# Patient Record
Sex: Male | Born: 1940 | Race: White | Hispanic: No | Marital: Married | Smoking: Former smoker
Health system: Southern US, Community
[De-identification: ages and names within clinical notes are randomized; demographics above are authoritative.]

## PROBLEM LIST (undated history)

## (undated) DIAGNOSIS — R609 Edema, unspecified: Secondary | ICD-10-CM

## (undated) DIAGNOSIS — F4310 Post-traumatic stress disorder, unspecified: Secondary | ICD-10-CM

## (undated) DIAGNOSIS — N200 Calculus of kidney: Secondary | ICD-10-CM

## (undated) DIAGNOSIS — L570 Actinic keratosis: Secondary | ICD-10-CM

## (undated) DIAGNOSIS — F32A Depression, unspecified: Secondary | ICD-10-CM

## (undated) DIAGNOSIS — R0600 Dyspnea, unspecified: Secondary | ICD-10-CM

## (undated) DIAGNOSIS — Z8719 Personal history of other diseases of the digestive system: Secondary | ICD-10-CM

## (undated) DIAGNOSIS — F329 Major depressive disorder, single episode, unspecified: Secondary | ICD-10-CM

## (undated) DIAGNOSIS — K219 Gastro-esophageal reflux disease without esophagitis: Secondary | ICD-10-CM

## (undated) DIAGNOSIS — E785 Hyperlipidemia, unspecified: Secondary | ICD-10-CM

## (undated) DIAGNOSIS — E119 Type 2 diabetes mellitus without complications: Secondary | ICD-10-CM

## (undated) DIAGNOSIS — R12 Heartburn: Secondary | ICD-10-CM

## (undated) DIAGNOSIS — F419 Anxiety disorder, unspecified: Secondary | ICD-10-CM

## (undated) DIAGNOSIS — C439 Malignant melanoma of skin, unspecified: Secondary | ICD-10-CM

## (undated) DIAGNOSIS — E039 Hypothyroidism, unspecified: Secondary | ICD-10-CM

## (undated) DIAGNOSIS — M1990 Unspecified osteoarthritis, unspecified site: Secondary | ICD-10-CM

## (undated) DIAGNOSIS — N400 Enlarged prostate without lower urinary tract symptoms: Secondary | ICD-10-CM

## (undated) HISTORY — DX: Depression, unspecified: F32.A

## (undated) HISTORY — DX: Actinic keratosis: L57.0

## (undated) HISTORY — PX: OTHER SURGICAL HISTORY: SHX169

## (undated) HISTORY — DX: Anxiety disorder, unspecified: F41.9

## (undated) HISTORY — DX: Heartburn: R12

## (undated) HISTORY — DX: Hyperlipidemia, unspecified: E78.5

## (undated) HISTORY — PX: HERNIA REPAIR: SHX51

## (undated) HISTORY — PX: TONSILLECTOMY: SUR1361

## (undated) HISTORY — PX: PROSTATE SURGERY: SHX751

## (undated) HISTORY — DX: Benign prostatic hyperplasia without lower urinary tract symptoms: N40.0

## (undated) HISTORY — DX: Post-traumatic stress disorder, unspecified: F43.10

## (undated) HISTORY — DX: Type 2 diabetes mellitus without complications: E11.9

## (undated) HISTORY — DX: Calculus of kidney: N20.0

## (undated) HISTORY — DX: Malignant melanoma of skin, unspecified: C43.9

## (undated) HISTORY — DX: Major depressive disorder, single episode, unspecified: F32.9

## (undated) HISTORY — DX: Unspecified osteoarthritis, unspecified site: M19.90

---
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
## 2006-03-09 ENCOUNTER — Ambulatory Visit: Payer: Self-pay | Admitting: Gastroenterology

## 2008-01-24 DIAGNOSIS — D239 Other benign neoplasm of skin, unspecified: Secondary | ICD-10-CM

## 2008-01-24 HISTORY — DX: Other benign neoplasm of skin, unspecified: D23.9

## 2008-05-11 ENCOUNTER — Ambulatory Visit: Payer: Self-pay | Admitting: Otolaryngology

## 2008-06-05 ENCOUNTER — Other Ambulatory Visit: Payer: Self-pay

## 2008-06-06 ENCOUNTER — Observation Stay: Payer: Self-pay | Admitting: Internal Medicine

## 2008-11-30 ENCOUNTER — Ambulatory Visit: Payer: Self-pay | Admitting: Family Medicine

## 2011-09-25 ENCOUNTER — Ambulatory Visit: Payer: Self-pay | Admitting: Family Medicine

## 2011-09-29 ENCOUNTER — Ambulatory Visit: Payer: Self-pay | Admitting: Family Medicine

## 2011-11-09 ENCOUNTER — Ambulatory Visit: Payer: Self-pay | Admitting: Family Medicine

## 2011-11-27 ENCOUNTER — Ambulatory Visit: Payer: Self-pay | Admitting: Family Medicine

## 2011-12-27 ENCOUNTER — Ambulatory Visit: Payer: Self-pay | Admitting: Family Medicine

## 2012-01-09 ENCOUNTER — Ambulatory Visit: Payer: Self-pay | Admitting: Internal Medicine

## 2012-03-28 ENCOUNTER — Ambulatory Visit: Payer: Self-pay | Admitting: Gastroenterology

## 2012-07-15 ENCOUNTER — Ambulatory Visit: Payer: Self-pay | Admitting: Urology

## 2012-07-15 LAB — CBC WITH DIFFERENTIAL/PLATELET
Basophil #: 0 10*3/uL (ref 0.0–0.1)
Eosinophil %: 8.1 %
HCT: 43.3 % (ref 40.0–52.0)
HGB: 14.1 g/dL (ref 13.0–18.0)
Lymphocyte #: 1.9 10*3/uL (ref 1.0–3.6)
MCHC: 32.6 g/dL (ref 32.0–36.0)
MCV: 89 fL (ref 80–100)
Monocyte %: 6.9 %
Neutrophil #: 3.4 10*3/uL (ref 1.4–6.5)
Platelet: 292 10*3/uL (ref 150–440)
RBC: 4.85 10*6/uL (ref 4.40–5.90)
RDW: 13.3 % (ref 11.5–14.5)
WBC: 6.3 10*3/uL (ref 3.8–10.6)

## 2012-07-17 ENCOUNTER — Ambulatory Visit: Payer: Self-pay | Admitting: Urology

## 2014-12-15 NOTE — Op Note (Signed)
PATIENT NAME:  Joseph Clements, Joseph Clements MR#:  [id number] DATE OF BIRTH:  07/28/1941  DATE OF PROCEDURE:  07/17/2012  PREOPERATIVE DIAGNOSES:  1. Benign prostatic hypertrophy with obstruction.  2. Urinary retention.   POSTOPERATIVE DIAGNOSES:  1. Benign prostatic hypertrophy with obstruction.  2. Urinary retention.   PROCEDURE: Photoselective vaporization of the prostate.   SURGEON: John Giovanni, MD   ASSISTANT: None.   ANESTHESIA: General.   INDICATIONS: A 74 year old male with a long history of benign prostatic hypertrophy who has been on alpha blocker for several years with progressively worsening lower urinary tract symptoms. He recently developed urinary retention. He has failed voiding trials x2. Cystoscopy was remarkable for trilobar enlargement. After discussion of treatment options, he has elected photoselective vaporization of the prostate.   DESCRIPTION OF PROCEDURE: He was taken to the Operating Room where general anesthetic was administered. He was placed in the low lithotomy position, and his Foley catheter was removed, and he was prepped and draped in the usual fashion.  A timeout was performed. A 22-French continuous-flow laser cystoscope was lubricated and passed under direct vision. The urethra was normal in caliber without stricture. There was lateral lobe enlargement and a large median lobe present. There was marked bladder neck elevation present. Prostatic urethral length was approximately 3 cm. No bladder mucosal lesions were identified. A small jack stone was present in the bladder. A GreenLight laser fiber was placed through the cystoscope. The GreenLight XPS generator was used. The median lobe bladder neck was then vaporized starting at a power of 80 watts. Once additional room was obtained, the power was subsequently increased to 150 watts. The median lobe was vaporized. The lateral lobes were then sequentially vaporized from the bladder neck to the verumontanum. Hemostasis was  obtained with laser coagulation. At the completion of the procedure, hemostasis was adequate. With the scope positioned at the verumontanum, the channel was open. Trigone was inspected and was normal in appearance. Ureteral orifices were remarkable for clear efflux. The jack stone was ensnared with the basket and removed without difficulty. The cystoscope was removed. A 20-French Foley catheter was placed with return of light rosae effluent upon irrigation. A B and O suppository was placed per rectum. He was taken to the PAC-U in stable condition. There were no complications. EBL was minimal.    ____________________________ Ronda Fairly. Bernardo Heater, MD scs:cbb D: 07/18/2012 07:08:25 ET T: 07/18/2012 10:08:49 ET JOB#: [id number]  cc: Nicki Reaper C. Bernardo Heater, MD, <Dictator> Abbie Sons MD ELECTRONICALLY SIGNED 08/14/2012 9:49

## 2015-10-14 ENCOUNTER — Encounter: Payer: Self-pay | Admitting: Adult Health

## 2015-10-15 ENCOUNTER — Encounter: Payer: Self-pay | Admitting: Urology

## 2015-10-15 ENCOUNTER — Ambulatory Visit (INDEPENDENT_AMBULATORY_CARE_PROVIDER_SITE_OTHER): Payer: Medicare Other | Admitting: Urology

## 2015-10-15 VITALS — BP 127/85 | HR 93 | Ht 66.5 in | Wt 164.7 lb

## 2015-10-15 DIAGNOSIS — N138 Other obstructive and reflux uropathy: Secondary | ICD-10-CM

## 2015-10-15 DIAGNOSIS — R310 Gross hematuria: Secondary | ICD-10-CM

## 2015-10-15 DIAGNOSIS — N200 Calculus of kidney: Secondary | ICD-10-CM | POA: Diagnosis not present

## 2015-10-15 DIAGNOSIS — N500 Atrophy of testis: Secondary | ICD-10-CM | POA: Insufficient documentation

## 2015-10-15 DIAGNOSIS — N401 Enlarged prostate with lower urinary tract symptoms: Secondary | ICD-10-CM

## 2015-10-15 LAB — URINALYSIS, COMPLETE
BILIRUBIN UA: NEGATIVE
GLUCOSE, UA: NEGATIVE
Leukocytes, UA: NEGATIVE
NITRITE UA: NEGATIVE
SPEC GRAV UA: 1.025 (ref 1.005–1.030)
Urobilinogen, Ur: 0.2 mg/dL (ref 0.2–1.0)
pH, UA: 5 (ref 5.0–7.5)

## 2015-10-15 LAB — MICROSCOPIC EXAMINATION

## 2015-10-15 MED ORDER — DIPHENHYDRAMINE HCL 50 MG PO TABS: ORAL_TABLET | ORAL | Status: DC

## 2015-10-15 MED ORDER — RANITIDINE HCL 150 MG PO TABS: ORAL_TABLET | ORAL | Status: DC

## 2015-10-15 MED ORDER — PREDNISONE 50 MG PO TABS: ORAL_TABLET | ORAL | Status: DC
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
## 2015-10-16 LAB — BUN+CREAT
BUN / CREAT RATIO: 8 — AB (ref 10–22)
BUN: 15 mg/dL (ref 8–27)
Creatinine, Ser: 1.94 mg/dL — ABNORMAL HIGH (ref 0.76–1.27)
GFR calc Af Amer: 38 mL/min/{1.73_m2} — ABNORMAL LOW (ref >59)
GFR calc non Af Amer: 33 mL/min/{1.73_m2} — ABNORMAL LOW (ref >59)

## 2015-10-17 LAB — CULTURE, URINE COMPREHENSIVE

## 2015-10-18 ENCOUNTER — Telehealth: Payer: Self-pay

## 2015-10-18 ENCOUNTER — Other Ambulatory Visit: Payer: Self-pay | Admitting: Urology

## 2015-10-18 DIAGNOSIS — R3129 Other microscopic hematuria: Secondary | ICD-10-CM

## 2015-10-18 NOTE — Telephone Encounter (Signed)
Spoke with pt in reference to diminished renal function. Pt stated that he has picked up the benadryl that was called in. Pt asked does he still need to take that? Please advise.

## 2015-10-18 NOTE — Telephone Encounter (Signed)
Per [name] pt will have a CT of abd/pelivs without contrast due to kidney function. Spoke with pt in reference to CT and benadryl prior. Made aware no need to take benadryl due to no contrast being used. Pt voiced understanding.

## 2015-10-18 NOTE — Telephone Encounter (Signed)
-----   Message from Nori Riis, PA-C sent at 10/17/2015  6:34 PM EST ----- Patient's renal function is diminished.  He cannot have the contrast.  He does not need to take the prep.

## 2015-10-21 ENCOUNTER — Ambulatory Visit
Admission: RE | Admit: 2015-10-21 | Discharge: 2015-10-21 | Disposition: A | Discharge status: Home / Self Care | Payer: Medicare Other | Source: Ambulatory Visit | Attending: Urology | Admitting: Urology

## 2015-10-21 DIAGNOSIS — R3129 Other microscopic hematuria: Secondary | ICD-10-CM | POA: Diagnosis present

## 2015-10-21 DIAGNOSIS — N132 Hydronephrosis with renal and ureteral calculous obstruction: Secondary | ICD-10-CM | POA: Diagnosis not present

## 2015-10-21 DIAGNOSIS — N201 Calculus of ureter: Secondary | ICD-10-CM | POA: Insufficient documentation

## 2015-10-21 DIAGNOSIS — K5730 Diverticulosis of large intestine without perforation or abscess without bleeding: Secondary | ICD-10-CM | POA: Diagnosis not present

## 2015-10-21 DIAGNOSIS — N400 Enlarged prostate without lower urinary tract symptoms: Secondary | ICD-10-CM | POA: Insufficient documentation

## 2015-10-22 ENCOUNTER — Ambulatory Visit (INDEPENDENT_AMBULATORY_CARE_PROVIDER_SITE_OTHER): Payer: Medicare Other | Admitting: Urology

## 2015-10-22 ENCOUNTER — Ambulatory Visit
Admission: RE | Admit: 2015-10-22 | Discharge: 2015-10-22 | Disposition: A | Discharge status: Home / Self Care | Payer: Medicare Other | Source: Ambulatory Visit | Attending: Urology | Admitting: Urology

## 2015-10-22 ENCOUNTER — Encounter: Payer: Self-pay | Admitting: Urology

## 2015-10-22 ENCOUNTER — Encounter
Admission: RE | Admit: 2015-10-22 | Discharge: 2015-10-22 | Disposition: A | Discharge status: Home / Self Care | Payer: Medicare Other | Source: Ambulatory Visit

## 2015-10-22 VITALS — BP 120/77 | HR 91 | Ht 66.0 in | Wt 162.6 lb

## 2015-10-22 DIAGNOSIS — N401 Enlarged prostate with lower urinary tract symptoms: Secondary | ICD-10-CM

## 2015-10-22 DIAGNOSIS — R310 Gross hematuria: Secondary | ICD-10-CM

## 2015-10-22 DIAGNOSIS — N500 Atrophy of testis: Secondary | ICD-10-CM | POA: Diagnosis not present

## 2015-10-22 DIAGNOSIS — N201 Calculus of ureter: Secondary | ICD-10-CM | POA: Insufficient documentation

## 2015-10-22 DIAGNOSIS — N138 Other obstructive and reflux uropathy: Secondary | ICD-10-CM

## 2015-10-22 DIAGNOSIS — N200 Calculus of kidney: Secondary | ICD-10-CM | POA: Insufficient documentation

## 2015-10-22 DIAGNOSIS — Z01810 Encounter for preprocedural cardiovascular examination: Secondary | ICD-10-CM | POA: Insufficient documentation

## 2015-10-22 LAB — URINALYSIS, COMPLETE
Bilirubin, UA: NEGATIVE
Glucose, UA: NEGATIVE
KETONES UA: NEGATIVE
NITRITE UA: POSITIVE — AB
SPEC GRAV UA: 1.025 (ref 1.005–1.030)
Urobilinogen, Ur: 0.2 mg/dL (ref 0.2–1.0)
pH, UA: 5 (ref 5.0–7.5)

## 2015-10-22 LAB — MICROSCOPIC EXAMINATION

## 2015-10-22 MED ORDER — ONDANSETRON 4 MG PO TBDP: 4.0000 mg | ORAL_TABLET | Freq: Three times a day (TID) | ORAL | Status: DC | PRN

## 2015-10-22 MED ORDER — SULFAMETHOXAZOLE-TRIMETHOPRIM 800-160 MG PO TABS: 1.0000 | ORAL_TABLET | Freq: Two times a day (BID) | ORAL | Status: DC

## 2015-10-22 MED ORDER — TAMSULOSIN HCL 0.4 MG PO CAPS: 0.4000 mg | ORAL_CAPSULE | Freq: Every day | ORAL | Status: DC

## 2015-10-22 NOTE — Patient Instructions (Signed)
I have sent the medication tamsulosin 0.4 mg to your pharmacy. This medication is to facilitate the passage of the right ureteral stone.  I have also sent the antibiotic Bactrim DS one tablet twice daily to your pharmacy. This medication is for infection. We will be sending her urine for culture and those results will be available Monday.  You have been given a strainer and please strain all urine. If you should pass the stone, please bring the stone to our office so we can send it for analysis.  If over the weekend, you should develop fevers or chills. You should seek treatment in the emergency room.
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
## 2015-10-24 LAB — CULTURE, URINE COMPREHENSIVE

## 2015-10-25 DIAGNOSIS — N201 Calculus of ureter: Secondary | ICD-10-CM | POA: Insufficient documentation

## 2015-10-26 ENCOUNTER — Telehealth: Payer: Self-pay

## 2015-10-26 NOTE — Telephone Encounter (Signed)
-----   Message from Nori Riis, PA-C sent at 10/22/2015  3:04 PM EST ----- Patient's stones are visible on the KUB.  They have not changed position.  Proceed with ESWL on Thursday.

## 2015-10-28 ENCOUNTER — Ambulatory Visit: Payer: Medicare Other

## 2015-10-28 ENCOUNTER — Encounter
Admission: RE | Disposition: A | Discharge status: Home / Self Care | Payer: Self-pay | Source: Ambulatory Visit | Attending: Urology

## 2015-10-28 ENCOUNTER — Encounter: Payer: Self-pay | Admitting: *Deleted

## 2015-10-28 ENCOUNTER — Ambulatory Visit
Admission: RE | Admit: 2015-10-28 | Discharge: 2015-10-28 | Disposition: A | Discharge status: Home / Self Care | Payer: Medicare Other | Source: Ambulatory Visit | Attending: Urology | Admitting: Urology

## 2015-10-28 DIAGNOSIS — F329 Major depressive disorder, single episode, unspecified: Secondary | ICD-10-CM | POA: Insufficient documentation

## 2015-10-28 DIAGNOSIS — N400 Enlarged prostate without lower urinary tract symptoms: Secondary | ICD-10-CM | POA: Diagnosis not present

## 2015-10-28 DIAGNOSIS — G4730 Sleep apnea, unspecified: Secondary | ICD-10-CM | POA: Diagnosis not present

## 2015-10-28 DIAGNOSIS — N200 Calculus of kidney: Secondary | ICD-10-CM

## 2015-10-28 DIAGNOSIS — M1990 Unspecified osteoarthritis, unspecified site: Secondary | ICD-10-CM | POA: Diagnosis not present

## 2015-10-28 DIAGNOSIS — N202 Calculus of kidney with calculus of ureter: Secondary | ICD-10-CM | POA: Diagnosis present

## 2015-10-28 DIAGNOSIS — Z79899 Other long term (current) drug therapy: Secondary | ICD-10-CM | POA: Diagnosis not present

## 2015-10-28 DIAGNOSIS — K449 Diaphragmatic hernia without obstruction or gangrene: Secondary | ICD-10-CM | POA: Diagnosis not present

## 2015-10-28 DIAGNOSIS — Z91013 Allergy to seafood: Secondary | ICD-10-CM | POA: Insufficient documentation

## 2015-10-28 DIAGNOSIS — Z79891 Long term (current) use of opiate analgesic: Secondary | ICD-10-CM | POA: Diagnosis not present

## 2015-10-28 DIAGNOSIS — E119 Type 2 diabetes mellitus without complications: Secondary | ICD-10-CM | POA: Insufficient documentation

## 2015-10-28 DIAGNOSIS — F419 Anxiety disorder, unspecified: Secondary | ICD-10-CM | POA: Diagnosis not present

## 2015-10-28 DIAGNOSIS — E785 Hyperlipidemia, unspecified: Secondary | ICD-10-CM | POA: Diagnosis not present

## 2015-10-28 DIAGNOSIS — Z85820 Personal history of malignant melanoma of skin: Secondary | ICD-10-CM | POA: Diagnosis not present

## 2015-10-28 DIAGNOSIS — F4310 Post-traumatic stress disorder, unspecified: Secondary | ICD-10-CM | POA: Insufficient documentation

## 2015-10-28 HISTORY — PX: EXTRACORPOREAL SHOCK WAVE LITHOTRIPSY: SHX1557

## 2015-10-28 LAB — GLUCOSE, CAPILLARY: GLUCOSE-CAPILLARY: 125 mg/dL — AB (ref 65–99)

## 2015-10-28 SURGERY — LITHOTRIPSY, ESWL
Anesthesia: Moderate Sedation | Laterality: Right

## 2015-10-28 MED ORDER — DIPHENHYDRAMINE HCL 50 MG/ML IJ SOLN
12.5000 mg | Freq: Once | INTRAMUSCULAR | Status: AC
  Administered 2015-10-28: 12.5 mg via INTRAVENOUS

## 2015-10-28 MED ORDER — SODIUM CHLORIDE 0.9 % IV SOLN
INTRAVENOUS | Status: DC
  Administered 2015-10-28: 500 mL via INTRAVENOUS

## 2015-10-28 MED ORDER — DIAZEPAM 5 MG PO TABS
ORAL_TABLET | ORAL | Status: AC
  Filled 2015-10-28: qty 2

## 2015-10-28 MED ORDER — ONDANSETRON 4 MG PO TBDP: 4.0000 mg | ORAL_TABLET | Freq: Three times a day (TID) | ORAL | Status: DC | PRN

## 2015-10-28 MED ORDER — HYDROCODONE-ACETAMINOPHEN 5-325 MG PO TABS: 1.0000 | ORAL_TABLET | Freq: Four times a day (QID) | ORAL | Status: DC | PRN

## 2015-10-28 MED ORDER — DIAZEPAM 5 MG PO TABS: 10.0000 mg | ORAL_TABLET | ORAL | Status: DC

## 2015-10-28 MED ORDER — DIPHENHYDRAMINE HCL 25 MG PO CAPS: 25.0000 mg | ORAL_CAPSULE | ORAL | Status: DC

## 2015-10-28 MED ORDER — CIPROFLOXACIN HCL 500 MG PO TABS
ORAL_TABLET | ORAL | Status: AC
  Filled 2015-10-28: qty 1

## 2015-10-28 MED ORDER — CIPROFLOXACIN HCL 500 MG PO TABS: 500.0000 mg | ORAL_TABLET | ORAL | Status: DC

## 2015-10-28 MED ORDER — DIPHENHYDRAMINE HCL 50 MG/ML IJ SOLN
INTRAMUSCULAR | Status: AC
  Administered 2015-10-28: 12.5 mg via INTRAVENOUS
  Filled 2015-10-28: qty 1

## 2015-10-28 MED ORDER — DIPHENHYDRAMINE HCL 25 MG PO CAPS
ORAL_CAPSULE | ORAL | Status: AC
  Filled 2015-10-28: qty 1

## 2015-10-28 MED ORDER — CIPROFLOXACIN IN D5W 400 MG/200ML IV SOLN
400.0000 mg | Freq: Once | INTRAVENOUS | Status: AC
  Administered 2015-10-28: 400 mg via INTRAVENOUS

## 2015-10-28 MED ORDER — ONDANSETRON HCL 4 MG/2ML IJ SOLN
INTRAMUSCULAR | Status: AC
  Administered 2015-10-28: 4 mg via INTRAVENOUS
  Filled 2015-10-28: qty 2

## 2015-10-28 MED ORDER — CIPROFLOXACIN IN D5W 400 MG/200ML IV SOLN
INTRAVENOUS | Status: AC
  Administered 2015-10-28: 400 mg via INTRAVENOUS
  Filled 2015-10-28: qty 200

## 2015-10-28 MED ORDER — DEXTROSE-NACL 5-0.45 % IV SOLN: INTRAVENOUS | Status: DC

## 2015-10-28 MED ORDER — DOCUSATE SODIUM 100 MG PO CAPS: 100.0000 mg | ORAL_CAPSULE | Freq: Two times a day (BID) | ORAL | Status: DC

## 2015-10-28 MED ORDER — DIPHENHYDRAMINE HCL 50 MG/ML IJ SOLN
INTRAMUSCULAR | Status: AC
  Filled 2015-10-28: qty 1

## 2015-10-28 MED ORDER — ONDANSETRON HCL 4 MG/2ML IJ SOLN
4.0000 mg | Freq: Four times a day (QID) | INTRAMUSCULAR | Status: DC | PRN
  Administered 2015-10-28: 4 mg via INTRAVENOUS

## 2015-10-28 NOTE — OR Nursing (Signed)
Tranferred down to [facility] truck via wheelchair.

## 2015-10-28 NOTE — Discharge Instructions (Signed)
°

## 2015-10-28 NOTE — OR Nursing (Signed)
Patient arrived to [facility] and reports being nauseated and has had nausea on and off for three weeks. Patient reports vomiting a small amt this am. Dr. Erlene Quan notified and she has cancelled po meds for this am and ordered IV med. Instead.

## 2015-10-28 NOTE — H&P (Signed)
See paper H&P 

## 2015-11-07 ENCOUNTER — Other Ambulatory Visit: Payer: Self-pay | Admitting: Urology

## 2015-11-11 ENCOUNTER — Encounter: Payer: Self-pay | Admitting: Urology

## 2015-11-11 ENCOUNTER — Ambulatory Visit
Admission: RE | Admit: 2015-11-11 | Discharge: 2015-11-11 | Disposition: A | Discharge status: Home / Self Care | Payer: Medicare Other | Source: Ambulatory Visit | Attending: Urology | Admitting: Urology

## 2015-11-11 ENCOUNTER — Ambulatory Visit (INDEPENDENT_AMBULATORY_CARE_PROVIDER_SITE_OTHER): Payer: Medicare Other | Admitting: Urology

## 2015-11-11 VITALS — BP 120/76 | HR 111 | Ht 66.0 in | Wt 160.4 lb

## 2015-11-11 DIAGNOSIS — N401 Enlarged prostate with lower urinary tract symptoms: Secondary | ICD-10-CM | POA: Diagnosis not present

## 2015-11-11 DIAGNOSIS — N138 Other obstructive and reflux uropathy: Secondary | ICD-10-CM

## 2015-11-11 DIAGNOSIS — N200 Calculus of kidney: Secondary | ICD-10-CM

## 2015-11-11 DIAGNOSIS — N201 Calculus of ureter: Secondary | ICD-10-CM | POA: Diagnosis not present

## 2015-11-11 DIAGNOSIS — R112 Nausea with vomiting, unspecified: Secondary | ICD-10-CM

## 2015-11-11 DIAGNOSIS — R310 Gross hematuria: Secondary | ICD-10-CM | POA: Diagnosis not present

## 2015-11-11 DIAGNOSIS — N132 Hydronephrosis with renal and ureteral calculous obstruction: Secondary | ICD-10-CM

## 2015-11-11 LAB — URINALYSIS, COMPLETE
BILIRUBIN UA: NEGATIVE
Glucose, UA: NEGATIVE
Ketones, UA: NEGATIVE
LEUKOCYTES UA: NEGATIVE
NITRITE UA: NEGATIVE
PH UA: 5 (ref 5.0–7.5)
Protein, UA: NEGATIVE
Specific Gravity, UA: 1.01 (ref 1.005–1.030)
Urobilinogen, Ur: 0.2 mg/dL (ref 0.2–1.0)

## 2015-11-11 LAB — MICROSCOPIC EXAMINATION: BACTERIA UA: NONE SEEN

## 2015-11-11 MED ORDER — ONDANSETRON 4 MG PO TBDP: 4.0000 mg | ORAL_TABLET | ORAL | Status: DC | PRN
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
## 2015-11-12 ENCOUNTER — Encounter: Payer: Self-pay | Admitting: Urology

## 2015-11-12 ENCOUNTER — Telehealth: Payer: Self-pay | Admitting: Urology

## 2015-11-12 LAB — BUN+CREAT
BUN/Creatinine Ratio: 12 (ref 10–22)
BUN: 16 mg/dL (ref 8–27)
CREATININE: 1.32 mg/dL — AB (ref 0.76–1.27)
GFR calc Af Amer: 61 mL/min/{1.73_m2} (ref >59)
GFR calc non Af Amer: 52 mL/min/{1.73_m2} — ABNORMAL LOW (ref >59)

## 2015-11-12 NOTE — Telephone Encounter (Signed)
Patient will need a RUS and office visit in one month.

## 2015-11-15 ENCOUNTER — Telehealth: Payer: Self-pay

## 2015-11-15 NOTE — Telephone Encounter (Signed)
-----   Message from Nori Riis, PA-C sent at 11/12/2015 12:08 PM EDT ----- Spoke with patient regarding his lab results.  He is still having some nausea.  He will speak to Dr. Lovie Macadamia regarding this issue.  He will follow up with [place] in one month for RUS report.

## 2015-11-15 NOTE — Telephone Encounter (Signed)
noted 

## 2015-11-17 NOTE — Telephone Encounter (Signed)
Done ° ° °michelle °

## 2015-11-25 ENCOUNTER — Other Ambulatory Visit: Payer: Self-pay | Admitting: Urology

## 2015-12-03 ENCOUNTER — Ambulatory Visit
Admission: RE | Admit: 2015-12-03 | Discharge: 2015-12-03 | Disposition: A | Discharge status: Home / Self Care | Payer: Medicare Other | Source: Ambulatory Visit | Attending: Urology | Admitting: Urology

## 2015-12-03 DIAGNOSIS — N132 Hydronephrosis with renal and ureteral calculous obstruction: Secondary | ICD-10-CM

## 2015-12-03 DIAGNOSIS — N200 Calculus of kidney: Secondary | ICD-10-CM | POA: Diagnosis not present

## 2015-12-03 DIAGNOSIS — N281 Cyst of kidney, acquired: Secondary | ICD-10-CM | POA: Insufficient documentation

## 2015-12-13 ENCOUNTER — Ambulatory Visit (INDEPENDENT_AMBULATORY_CARE_PROVIDER_SITE_OTHER): Payer: Medicare Other | Admitting: Urology

## 2015-12-13 ENCOUNTER — Encounter: Payer: Self-pay | Admitting: Urology

## 2015-12-13 VITALS — BP 130/81 | HR 101 | Ht 66.0 in | Wt 163.1 lb

## 2015-12-13 DIAGNOSIS — R310 Gross hematuria: Secondary | ICD-10-CM

## 2015-12-13 DIAGNOSIS — N132 Hydronephrosis with renal and ureteral calculous obstruction: Secondary | ICD-10-CM

## 2015-12-13 DIAGNOSIS — N201 Calculus of ureter: Secondary | ICD-10-CM

## 2015-12-13 DIAGNOSIS — N200 Calculus of kidney: Secondary | ICD-10-CM

## 2015-12-13 DIAGNOSIS — R112 Nausea with vomiting, unspecified: Secondary | ICD-10-CM | POA: Diagnosis not present

## 2015-12-13 LAB — URINALYSIS, COMPLETE
BILIRUBIN UA: NEGATIVE
Glucose, UA: NEGATIVE
LEUKOCYTES UA: NEGATIVE
Nitrite, UA: NEGATIVE
PH UA: 5 (ref 5.0–7.5)
UUROB: 0.2 mg/dL (ref 0.2–1.0)

## 2015-12-13 LAB — MICROSCOPIC EXAMINATION: Epithelial Cells (non renal): NONE SEEN /hpf (ref 0–10)
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
## 2015-12-14 LAB — BASIC METABOLIC PANEL
BUN/Creatinine Ratio: 21 (ref 10–24)
BUN: 23 mg/dL (ref 8–27)
CALCIUM: 9.8 mg/dL (ref 8.6–10.2)
CO2: 22 mmol/L (ref 18–29)
CREATININE: 1.09 mg/dL (ref 0.76–1.27)
Chloride: 101 mmol/L (ref 96–106)
GFR calc Af Amer: 76 mL/min/{1.73_m2} (ref >59)
GFR, EST NON AFRICAN AMERICAN: 66 mL/min/{1.73_m2} (ref >59)
GLUCOSE: 136 mg/dL — AB (ref 65–99)
POTASSIUM: 4.7 mmol/L (ref 3.5–5.2)
Sodium: 143 mmol/L (ref 134–144)

## 2015-12-14 LAB — HEMOGLOBIN A1C
ESTIMATED AVERAGE GLUCOSE: 134 mg/dL
HEMOGLOBIN A1C: 6.3 % — AB (ref 4.8–5.6)

## 2015-12-15 LAB — CULTURE, URINE COMPREHENSIVE

## 2015-12-16 ENCOUNTER — Telehealth: Payer: Self-pay

## 2015-12-16 NOTE — Telephone Encounter (Signed)
Spoke with pt in reference to -ucx. Pt stated he has seen Dr. Lovie Macadamia in reference to n/v. Pt stated that he gave him some medication and he is to f/u in 102mo.

## 2015-12-16 NOTE — Telephone Encounter (Signed)
-----   Message from Nori Riis, PA-C sent at 12/16/2015  7:57 AM EDT ----- He does not have an UTI.  Has he seen Dr. Lovie Macadamia about his nausea and vomiting?

## 2016-05-03 ENCOUNTER — Other Ambulatory Visit: Payer: Self-pay

## 2017-09-10 DIAGNOSIS — C4491 Basal cell carcinoma of skin, unspecified: Secondary | ICD-10-CM

## 2017-09-10 HISTORY — DX: Basal cell carcinoma of skin, unspecified: C44.91

## 2017-12-04 ENCOUNTER — Ambulatory Visit: Payer: Medicare Other | Admitting: Anesthesiology

## 2017-12-04 ENCOUNTER — Ambulatory Visit
Admission: RE | Admit: 2017-12-04 | Discharge: 2017-12-04 | Disposition: A | Discharge status: Home / Self Care | Payer: Medicare Other | Source: Ambulatory Visit | Attending: Gastroenterology | Admitting: Gastroenterology

## 2017-12-04 ENCOUNTER — Encounter: Payer: Self-pay | Admitting: Anesthesiology

## 2017-12-04 ENCOUNTER — Other Ambulatory Visit: Payer: Self-pay

## 2017-12-04 ENCOUNTER — Encounter
Admission: RE | Disposition: A | Discharge status: Home / Self Care | Payer: Self-pay | Source: Ambulatory Visit | Attending: Gastroenterology

## 2017-12-04 DIAGNOSIS — Z1211 Encounter for screening for malignant neoplasm of colon: Secondary | ICD-10-CM | POA: Diagnosis present

## 2017-12-04 DIAGNOSIS — Z86010 Personal history of colonic polyps: Secondary | ICD-10-CM | POA: Insufficient documentation

## 2017-12-04 DIAGNOSIS — Z85820 Personal history of malignant melanoma of skin: Secondary | ICD-10-CM | POA: Insufficient documentation

## 2017-12-04 DIAGNOSIS — E119 Type 2 diabetes mellitus without complications: Secondary | ICD-10-CM | POA: Diagnosis not present

## 2017-12-04 DIAGNOSIS — Z7984 Long term (current) use of oral hypoglycemic drugs: Secondary | ICD-10-CM | POA: Diagnosis not present

## 2017-12-04 DIAGNOSIS — N400 Enlarged prostate without lower urinary tract symptoms: Secondary | ICD-10-CM | POA: Diagnosis not present

## 2017-12-04 DIAGNOSIS — K621 Rectal polyp: Secondary | ICD-10-CM | POA: Diagnosis not present

## 2017-12-04 DIAGNOSIS — K635 Polyp of colon: Secondary | ICD-10-CM | POA: Diagnosis not present

## 2017-12-04 DIAGNOSIS — Z79899 Other long term (current) drug therapy: Secondary | ICD-10-CM | POA: Diagnosis not present

## 2017-12-04 DIAGNOSIS — F4310 Post-traumatic stress disorder, unspecified: Secondary | ICD-10-CM | POA: Insufficient documentation

## 2017-12-04 DIAGNOSIS — K640 First degree hemorrhoids: Secondary | ICD-10-CM | POA: Insufficient documentation

## 2017-12-04 DIAGNOSIS — F418 Other specified anxiety disorders: Secondary | ICD-10-CM | POA: Diagnosis not present

## 2017-12-04 DIAGNOSIS — K219 Gastro-esophageal reflux disease without esophagitis: Secondary | ICD-10-CM | POA: Diagnosis not present

## 2017-12-04 DIAGNOSIS — K5730 Diverticulosis of large intestine without perforation or abscess without bleeding: Secondary | ICD-10-CM | POA: Diagnosis not present

## 2017-12-04 DIAGNOSIS — Z91013 Allergy to seafood: Secondary | ICD-10-CM | POA: Diagnosis not present

## 2017-12-04 DIAGNOSIS — K2950 Unspecified chronic gastritis without bleeding: Secondary | ICD-10-CM | POA: Diagnosis not present

## 2017-12-04 DIAGNOSIS — R112 Nausea with vomiting, unspecified: Secondary | ICD-10-CM | POA: Diagnosis not present

## 2017-12-04 HISTORY — DX: Major depressive disorder, single episode, unspecified: F32.9

## 2017-12-04 HISTORY — PX: ESOPHAGOGASTRODUODENOSCOPY (EGD) WITH PROPOFOL: SHX5813

## 2017-12-04 HISTORY — DX: Depression, unspecified: F32.A

## 2017-12-04 HISTORY — PX: COLONOSCOPY WITH PROPOFOL: SHX5780

## 2017-12-04 LAB — GLUCOSE, CAPILLARY: GLUCOSE-CAPILLARY: 105 mg/dL — AB (ref 65–99)

## 2017-12-04 SURGERY — COLONOSCOPY WITH PROPOFOL
Anesthesia: General

## 2017-12-04 MED ORDER — PROPOFOL 10 MG/ML IV BOLUS
INTRAVENOUS | Status: AC
  Filled 2017-12-04: qty 20

## 2017-12-04 MED ORDER — PROPOFOL 500 MG/50ML IV EMUL
INTRAVENOUS | Status: DC | PRN
  Administered 2017-12-04: 200 ug/kg/min via INTRAVENOUS

## 2017-12-04 MED ORDER — SODIUM CHLORIDE 0.9 % IV SOLN
INTRAVENOUS | Status: DC
  Administered 2017-12-04: 08:00:00 via INTRAVENOUS

## 2017-12-04 MED ORDER — PROPOFOL 500 MG/50ML IV EMUL
INTRAVENOUS | Status: AC
  Filled 2017-12-04: qty 50

## 2017-12-04 MED ORDER — LIDOCAINE HCL (PF) 2 % IJ SOLN
INTRAMUSCULAR | Status: AC
  Filled 2017-12-04: qty 10

## 2017-12-04 MED ORDER — SODIUM CHLORIDE 0.9 % IV SOLN: INTRAVENOUS | Status: DC

## 2017-12-04 MED ORDER — PROPOFOL 10 MG/ML IV BOLUS
INTRAVENOUS | Status: DC | PRN
  Administered 2017-12-04: 80 mg via INTRAVENOUS

## 2017-12-04 MED ORDER — LIDOCAINE HCL (CARDIAC) 20 MG/ML IV SOLN
INTRAVENOUS | Status: DC | PRN
  Administered 2017-12-04: 100 mg via INTRAVENOUS

## 2017-12-04 NOTE — Anesthesia Postprocedure Evaluation (Signed)
Anesthesia Post Note  Patient: REINO LYBBERT  Procedure(s) Performed: COLONOSCOPY WITH PROPOFOL (N/A ) ESOPHAGOGASTRODUODENOSCOPY (EGD) WITH PROPOFOL (N/A )  Patient location during evaluation: Endoscopy Anesthesia Type: General Level of consciousness: awake and alert Pain management: pain level controlled Vital Signs Assessment: post-procedure vital signs reviewed and stable Respiratory status: spontaneous breathing, nonlabored ventilation, respiratory function stable and patient connected to nasal cannula oxygen Cardiovascular status: blood pressure returned to baseline and stable Postop Assessment: no apparent nausea or vomiting Anesthetic complications: no     Last Vitals:  Vitals:   12/04/17 0930 12/04/17 0940  BP: 117/75 123/84  Pulse: 64 74  Resp: 10 18  Temp:    SpO2: 100% 100%    Last Pain:  Vitals:   12/04/17 0929  TempSrc: Tympanic  PainSc:                  Meghanne Pletz S

## 2017-12-04 NOTE — Transfer of Care (Signed)
Immediate Anesthesia Transfer of Care Note  Patient: Joseph Clements  Procedure(s) Performed: COLONOSCOPY WITH PROPOFOL (N/A ) ESOPHAGOGASTRODUODENOSCOPY (EGD) WITH PROPOFOL (N/A )  Patient Location: PACU and Endoscopy Unit  Anesthesia Type:General  Level of Consciousness: drowsy  Airway & Oxygen Therapy: Patient Spontanous Breathing  Post-op Assessment: Report given to RN  Post vital signs: stable  Last Vitals:  Vitals Value Taken Time  BP 105/71 12/04/2017  9:26 AM  Temp 36.7 C 12/04/2017  9:29 AM  Pulse 67 12/04/2017  9:28 AM  Resp 10 12/04/2017  9:28 AM  SpO2 100 % 12/04/2017  9:28 AM  Vitals shown include unvalidated device data.  Last Pain:  Vitals:   12/04/17 0929  TempSrc: Tympanic  PainSc:          Complications: No apparent anesthesia complications

## 2017-12-04 NOTE — Anesthesia Post-op Follow-up Note (Signed)
Anesthesia QCDR form completed.        

## 2017-12-04 NOTE — Anesthesia Preprocedure Evaluation (Signed)
Anesthesia Evaluation  Patient identified by MRN, date of birth, ID band Patient awake    Reviewed: Allergy & Precautions, NPO status , Patient's Chart, lab work & pertinent test results, reviewed documented beta blocker date and time   Airway Mallampati: III  TM Distance: >3 FB     Dental  (+) Chipped   Pulmonary former smoker,           Cardiovascular      Neuro/Psych PSYCHIATRIC DISORDERS Anxiety Depression    GI/Hepatic   Endo/Other  diabetes, Type 2  Renal/GU Renal disease     Musculoskeletal  (+) Arthritis ,   Abdominal   Peds  Hematology   Anesthesia Other Findings   Reproductive/Obstetrics                             Anesthesia Physical Anesthesia Plan  ASA: III  Anesthesia Plan: General   Post-op Pain Management:    Induction: Intravenous  PONV Risk Score and Plan:   Airway Management Planned:   Additional Equipment:   Intra-op Plan:   Post-operative Plan:   Informed Consent: I have reviewed the patients History and Physical, chart, labs and discussed the procedure including the risks, benefits and alternatives for the proposed anesthesia with the patient or authorized representative who has indicated his/her understanding and acceptance.     Plan Discussed with: CRNA  Anesthesia Plan Comments:         Anesthesia Quick Evaluation

## 2017-12-04 NOTE — Op Note (Signed)
[facility] Patient Name: Joseph Clements Procedure Date: 12/04/2017 8:08 AM MRN: [id number] Account #: [id number] Date of Birth: 1941/01/23 Admit Type: Outpatient Age: 77 Room: [facility] ENDO ROOM 2 Gender: Male Note Status: Finalized Procedure:            Upper GI endoscopy Indications:          Gastro-esophageal reflux disease, Nausea with vomiting Providers:            Lollie Sails, MD Referring MD:         Youlanda Roys. Lovie Macadamia, MD (Referring MD) Complications:        No immediate complications. Procedure:            Pre-Anesthesia Assessment:                       - ASA Grade Assessment: III - A patient with severe                        systemic disease.                       After obtaining informed consent, the endoscope was                        passed under direct vision. Throughout the procedure,                        the patient's blood pressure, pulse, and oxygen                        saturations were monitored continuously. The Endoscope                        was introduced through the mouth, and advanced to the                        third part of duodenum. The upper GI endoscopy was                        accomplished without difficulty. The patient tolerated                        the procedure well. Findings:      The Z-line was variable. Biopsies were taken with a cold forceps for       histology.      The lower third of the esophagus was moderately tortuous.      The exam of the esophagus was otherwise normal.      Diffuse nodular mucosa was found in the gastric body. Biopsies were       taken with a cold forceps for histology.      A single 3 mm mucosal papule (nodule) with no bleeding and no stigmata       of recent bleeding was found in the prepyloric region of the stomach.       Biopsies were taken with a cold forceps for histology.      Biopsies were taken with a cold forceps in the gastric body and in the       gastric  antrum for histology.      The cardia and gastric fundus were normal on retroflexion.  The examined duodenum was normal. Biopsies were taken with a cold       forceps for histology. Impression:           - Z-line variable. Biopsied.                       - Tortuous esophagus.                       - Nodular mucosa in the gastric body. Biopsied.                       - A single mucosal papule (nodule) found in the                        stomach. Biopsied.                       - Normal examined duodenum. Biopsied.                       - Biopsies were taken with a cold forceps for histology                        in the gastric body and in the gastric antrum. Recommendation:       - Use Protonix (pantoprazole) 40 mg PO daily daily.                        Hold omeprazole.                       - Return to GI clinic in 4 weeks.                       - bold dicyclomine until follow up in one month Procedure Code(s):    --- Professional ---                       [phone], Esophagogastroduodenoscopy, flexible, transoral;                        with biopsy, single or multiple Diagnosis Code(s):    --- Professional ---                       K22.8, Other specified diseases of esophagus                       Q39.9, Congenital malformation of esophagus, unspecified                       K31.89, Other diseases of stomach and duodenum                       K21.9, Gastro-esophageal reflux disease without                        esophagitis                       R11.2, Nausea with vomiting, unspecified CPT copyright 2017 American Medical Association. All rights reserved. The codes documented in this report are preliminary and upon coder review may  be revised to meet current  compliance requirements. Lollie Sails, MD 12/04/2017 8:55:10 AM This report has been signed electronically. Number of Addenda: 0 Note Initiated On: 12/04/2017 8:08 AM      [facility]

## 2017-12-04 NOTE — H&P (Signed)
Outpatient short stay form Pre-procedure 12/04/2017 8:21 AM Lollie Sails MD  Primary Physician: Dr. Juluis Pitch  Reason for visit: EGD and colonoscopy  History of present illness: Patient is a 77 year old male presenting today as above.  He has a personal history of nausea and vomiting going back about 2 years.  He connects this with possible issues related to the loss of his daughter.  He will at times bring up food at other times it will be just liquid.  It does not wake him up in the middle of the night.  He does not take aspirins or NSAIDs.  He tolerated his prep well.  Takes no blood thinning agent.  Current Facility-Administered Medications:  .  0.9 %  sodium chloride infusion, , Intravenous, Continuous, Lollie Sails, MD, Last Rate: 20 mL/hr at 12/04/17 0809 .  0.9 %  sodium chloride infusion, , Intravenous, Continuous, Lollie Sails, MD  Medications Prior to Admission  Medication Sig Dispense Refill Last Dose  . ALPRAZolam (NIRAVAM) 0.25 MG dissolvable tablet Take 0.25 mg by mouth.   12/02/2017 at Unknown time  . atorvastatin (LIPITOR) 80 MG tablet Take 80 mg by mouth daily.   12/02/2017 at 12/02/17  . buPROPion (WELLBUTRIN XL) 150 MG 24 hr tablet Take 450 mg by mouth daily.   12/02/2017 at Unknown time  . buPROPion (WELLBUTRIN XL) 300 MG 24 hr tablet    12/02/2017 at Unknown time  . dicyclomine (BENTYL) 10 MG capsule Take 10 mg by mouth 4 (four) times daily -  before meals and at bedtime.   12/02/2017 at Unknown time  . escitalopram (LEXAPRO) 5 MG tablet Reported on 10/15/2015   12/02/2017 at Unknown time  . escitalopram (LEXAPRO) 5 MG tablet Take 10 mg by mouth daily.   12/02/2017 at Unknown time  . metFORMIN (GLUCOPHAGE-XR) 500 MG 24 hr tablet Take by mouth.   12/02/2017 at Unknown time  . metFORMIN (GLUCOPHAGE-XR) 500 MG 24 hr tablet Take 1,000 mg by mouth 2 (two) times daily.   12/02/2017 at Unknown time  . omeprazole (PRILOSEC) 20 MG capsule Take by mouth.   12/02/2017 at Unknown  time  . omeprazole (PRILOSEC) 20 MG capsule Take 20 mg by mouth 2 (two) times daily before a meal.   12/02/17 at Unknown time  . ondansetron (ZOFRAN ODT) 4 MG disintegrating tablet Take 1 tablet (4 mg total) by mouth every 4 (four) hours as needed for nausea or vomiting. 30 tablet 0 12/02/2017 at Unknown time  . ondansetron (ZOFRAN) 4 MG tablet Reported on 12/13/2015   12/02/2017 at Unknown time  . pioglitazone (ACTOS) 30 MG tablet    12/02/2017 at Unknown time  . ranitidine (ZANTAC) 150 MG tablet Reported on 12/13/2015   12/02/2017 at Unknown time  . tamsulosin (FLOMAX) 0.4 MG CAPS capsule Take 1 capsule (0.4 mg total) by mouth daily. 30 capsule 0 12/02/2017 at Unknown time  . docusate sodium (COLACE) 100 MG capsule Take 1 capsule (100 mg total) by mouth 2 (two) times daily. (Patient not taking: Reported on 12/13/2015) 60 capsule 0 Not Taking  . HYDROcodone-acetaminophen (NORCO/VICODIN) 5-325 MG tablet Take 1-2 tablets by mouth every 6 (six) hours as needed for moderate pain. (Patient not taking: Reported on 11/11/2015) 10 tablet 0 Not Taking     Allergies  Allergen Reactions  . Shellfish Allergy Nausea And Vomiting     Past Medical History:  Diagnosis Date  . Anxiety and depression   . Arthritis   . BPH (  benign prostatic hyperplasia)   . Depression   . Diabetes ([place])   . Heartburn   . HLD (hyperlipidemia)   . Kidney stone   . Melanoma ([place])   . PTSD (post-traumatic stress disorder)     Review of systems:      Physical Exam    Heart and lungs: Regular rate and rhythm without rub or gallop, lungs are bilaterally clear.    HEENT: Normocephalic atraumatic eyes are anicteric    Other:    Pertinant exam for procedure: Soft nontender nondistended bowel sounds positive normoactive    Planned proceedures: EGD, colonoscopy and indicated procedures. I have discussed the risks benefits and complications of procedures to include not limited to bleeding, infection, perforation and the risk of  sedation and the patient wishes to proceed.    Lollie Sails, MD Gastroenterology 12/04/2017  8:21 AM

## 2017-12-04 NOTE — Op Note (Signed)
[facility] Gastroenterology Patient Name: Joseph Clements Procedure Date: 12/04/2017 8:07 AM MRN: [id number] Account #: [id number] Date of Birth: 1941/07/05 Admit Type: Outpatient Age: 77 Room: [facility] ENDO ROOM 2 Gender: Male Note Status: Finalized Procedure:            Colonoscopy Indications:          Personal history of colonic polyps Providers:            Lollie Sails, MD Referring MD:         Youlanda Roys. Lovie Macadamia, MD (Referring MD) Medicines:            Monitored Anesthesia Care Complications:        No immediate complications. Procedure:            Pre-Anesthesia Assessment:                       - ASA Grade Assessment: III - A patient with severe                        systemic disease.                       After obtaining informed consent, the colonoscope was                        passed under direct vision. Throughout the procedure,                        the patient's blood pressure, pulse, and oxygen                        saturations were monitored continuously. The                        Colonoscope was introduced through the anus and                        advanced to the the cecum, identified by appendiceal                        orifice and ileocecal valve. The colonoscopy was                        performed without difficulty. The patient tolerated the                        procedure well. The quality of the bowel preparation                        was good. Findings:      A 2 mm polyp was found in the rectum. The polyp was sessile. The polyp       was removed with a cold biopsy forceps. Resection and retrieval were       complete.      Three sessile polyps were found in the cecum. The polyps were 1 to 3 mm       in size. These polyps were removed with a cold biopsy forceps. Resection       and retrieval were complete.      Multiple small-mouthed diverticula were found in the sigmoid colon and  descending colon.      Non-bleeding  internal hemorrhoids were found during retroflexion. The       hemorrhoids were medium-sized and Grade I (internal hemorrhoids that do       not prolapse).      The digital rectal exam findings include enlarged prostate.      The terminal ileum appeared normal. Impression:           - One 2 mm polyp in the rectum, removed with a cold                        biopsy forceps. Resected and retrieved.                       - Three 1 to 3 mm polyps in the cecum, removed with a                        cold biopsy forceps. Resected and retrieved.                       - Diverticulosis in the sigmoid colon and in the                        descending colon.                       - Non-bleeding internal hemorrhoids.                       - Enlarged prostate found on digital rectal exam. Recommendation:       - Discharge patient to home.                       - Await pathology results.                       - Return to GI clinic in 3 weeks. Procedure Code(s):    --- Professional ---                       [phone], Colonoscopy, flexible; with biopsy, single or                        multiple Diagnosis Code(s):    --- Professional ---                       K62.1, Rectal polyp                       D12.0, Benign neoplasm of cecum                       K64.0, First degree hemorrhoids                       Z86.010, Personal history of colonic polyps                       K57.30, Diverticulosis of large intestine without                        perforation or abscess without bleeding  N40.0, Benign prostatic hyperplasia without lower                        urinary tract symptoms CPT copyright 2017 [employer]. All rights reserved. The codes documented in this report are preliminary and upon coder review may  be revised to meet current compliance requirements. Lollie Sails, MD 12/04/2017 9:21:17 AM This report has been signed electronically. Number of Addenda:  0 Note Initiated On: 12/04/2017 8:07 AM Scope Withdrawal Time: 0 hours 9 minutes 58 seconds  Total Procedure Duration: 0 hours 19 minutes 13 seconds       [facility]

## 2017-12-05 LAB — SURGICAL PATHOLOGY

## 2017-12-06 ENCOUNTER — Encounter: Payer: Self-pay | Admitting: Gastroenterology

## 2017-12-13 ENCOUNTER — Encounter: Payer: Self-pay | Admitting: *Deleted

## 2017-12-26 ENCOUNTER — Ambulatory Visit: Payer: Medicare Other | Admitting: Anesthesiology

## 2017-12-26 ENCOUNTER — Ambulatory Visit
Admission: RE | Admit: 2017-12-26 | Discharge: 2017-12-26 | Disposition: A | Discharge status: Home / Self Care | Payer: Medicare Other | Source: Ambulatory Visit | Attending: Ophthalmology | Admitting: Ophthalmology

## 2017-12-26 ENCOUNTER — Encounter
Admission: RE | Disposition: A | Discharge status: Home / Self Care | Payer: Self-pay | Source: Ambulatory Visit | Attending: Ophthalmology

## 2017-12-26 DIAGNOSIS — Z87891 Personal history of nicotine dependence: Secondary | ICD-10-CM | POA: Insufficient documentation

## 2017-12-26 DIAGNOSIS — H2512 Age-related nuclear cataract, left eye: Secondary | ICD-10-CM | POA: Diagnosis present

## 2017-12-26 DIAGNOSIS — Z8719 Personal history of other diseases of the digestive system: Secondary | ICD-10-CM | POA: Insufficient documentation

## 2017-12-26 DIAGNOSIS — Z7984 Long term (current) use of oral hypoglycemic drugs: Secondary | ICD-10-CM | POA: Diagnosis not present

## 2017-12-26 DIAGNOSIS — M1990 Unspecified osteoarthritis, unspecified site: Secondary | ICD-10-CM | POA: Diagnosis not present

## 2017-12-26 DIAGNOSIS — K589 Irritable bowel syndrome without diarrhea: Secondary | ICD-10-CM | POA: Diagnosis not present

## 2017-12-26 DIAGNOSIS — Z91013 Allergy to seafood: Secondary | ICD-10-CM | POA: Insufficient documentation

## 2017-12-26 DIAGNOSIS — E119 Type 2 diabetes mellitus without complications: Secondary | ICD-10-CM | POA: Diagnosis not present

## 2017-12-26 DIAGNOSIS — Z85828 Personal history of other malignant neoplasm of skin: Secondary | ICD-10-CM | POA: Insufficient documentation

## 2017-12-26 DIAGNOSIS — F419 Anxiety disorder, unspecified: Secondary | ICD-10-CM | POA: Insufficient documentation

## 2017-12-26 DIAGNOSIS — F329 Major depressive disorder, single episode, unspecified: Secondary | ICD-10-CM | POA: Insufficient documentation

## 2017-12-26 DIAGNOSIS — E039 Hypothyroidism, unspecified: Secondary | ICD-10-CM | POA: Insufficient documentation

## 2017-12-26 DIAGNOSIS — Z79899 Other long term (current) drug therapy: Secondary | ICD-10-CM | POA: Insufficient documentation

## 2017-12-26 DIAGNOSIS — K219 Gastro-esophageal reflux disease without esophagitis: Secondary | ICD-10-CM | POA: Diagnosis not present

## 2017-12-26 HISTORY — DX: Dyspnea, unspecified: R06.00

## 2017-12-26 HISTORY — DX: Personal history of other diseases of the digestive system: Z87.19

## 2017-12-26 HISTORY — DX: Edema, unspecified: R60.9

## 2017-12-26 HISTORY — DX: Hypothyroidism, unspecified: E03.9

## 2017-12-26 HISTORY — PX: CATARACT EXTRACTION W/PHACO: SHX586

## 2017-12-26 HISTORY — DX: Gastro-esophageal reflux disease without esophagitis: K21.9

## 2017-12-26 LAB — GLUCOSE, CAPILLARY: Glucose-Capillary: 127 mg/dL — ABNORMAL HIGH (ref 65–99)

## 2017-12-26 SURGERY — PHACOEMULSIFICATION, CATARACT, WITH IOL INSERTION
Anesthesia: Monitor Anesthesia Care | Site: Eye | Laterality: Left | Wound class: "Clean "

## 2017-12-26 MED ORDER — LIDOCAINE HCL (PF) 4 % IJ SOLN
INTRAMUSCULAR | Status: AC
  Filled 2017-12-26: qty 5

## 2017-12-26 MED ORDER — EPINEPHRINE PF 1 MG/ML IJ SOLN
INTRAOCULAR | Status: DC | PRN
  Administered 2017-12-26: 08:00:00 via OPHTHALMIC

## 2017-12-26 MED ORDER — MIDAZOLAM HCL 2 MG/2ML IJ SOLN
INTRAMUSCULAR | Status: DC | PRN
  Administered 2017-12-26 (×2): 1 mg via INTRAVENOUS

## 2017-12-26 MED ORDER — CARBACHOL 0.01 % IO SOLN
INTRAOCULAR | Status: DC | PRN
  Administered 2017-12-26: 0.5 mL via INTRAOCULAR

## 2017-12-26 MED ORDER — MOXIFLOXACIN HCL 0.5 % OP SOLN
OPHTHALMIC | Status: AC
  Administered 2017-12-26: 1 [drp] via OPHTHALMIC
  Filled 2017-12-26: qty 3

## 2017-12-26 MED ORDER — POVIDONE-IODINE 5 % OP SOLN
OPHTHALMIC | Status: AC
  Filled 2017-12-26: qty 30

## 2017-12-26 MED ORDER — EPINEPHRINE PF 1 MG/ML IJ SOLN
INTRAMUSCULAR | Status: AC
  Filled 2017-12-26: qty 2

## 2017-12-26 MED ORDER — ARMC OPHTHALMIC DILATING DROPS
OPHTHALMIC | Status: AC
  Administered 2017-12-26: 1 via OPHTHALMIC
  Filled 2017-12-26: qty 0.4

## 2017-12-26 MED ORDER — MOXIFLOXACIN HCL 0.5 % OP SOLN
1.0000 [drp] | OPHTHALMIC | Status: DC | PRN
  Administered 2017-12-26 (×3): 1 [drp] via OPHTHALMIC

## 2017-12-26 MED ORDER — NEOMYCIN-POLYMYXIN-DEXAMETH 3.5-10000-0.1 OP OINT
TOPICAL_OINTMENT | OPHTHALMIC | Status: AC
  Filled 2017-12-26: qty 3.5

## 2017-12-26 MED ORDER — MIDAZOLAM HCL 2 MG/2ML IJ SOLN
INTRAMUSCULAR | Status: AC
  Filled 2017-12-26: qty 2

## 2017-12-26 MED ORDER — NA HYALUR & NA CHOND-NA HYALUR 0.55-0.5 ML IO KIT
PACK | INTRAOCULAR | Status: AC
  Filled 2017-12-26: qty 1.05

## 2017-12-26 MED ORDER — NEOMYCIN-POLYMYXIN-DEXAMETH 0.1 % OP OINT
TOPICAL_OINTMENT | OPHTHALMIC | Status: DC | PRN
  Administered 2017-12-26: 1 via OPHTHALMIC

## 2017-12-26 MED ORDER — SODIUM CHLORIDE 0.9 % IV SOLN
INTRAVENOUS | Status: DC
  Administered 2017-12-26: 07:00:00 via INTRAVENOUS

## 2017-12-26 MED ORDER — LIDOCAINE HCL (PF) 4 % IJ SOLN
INTRAOCULAR | Status: DC | PRN
  Administered 2017-12-26: 4 mL via OPHTHALMIC

## 2017-12-26 MED ORDER — POVIDONE-IODINE 5 % OP SOLN
OPHTHALMIC | Status: DC | PRN
  Administered 2017-12-26: 1 via OPHTHALMIC

## 2017-12-26 MED ORDER — NA HYALUR & NA CHOND-NA HYALUR 0.4-0.35 ML IO KIT
PACK | INTRAOCULAR | Status: DC | PRN
  Administered 2017-12-26: .35 mL via INTRAOCULAR

## 2017-12-26 MED ORDER — ARMC OPHTHALMIC DILATING DROPS
1.0000 "application " | OPHTHALMIC | Status: AC
  Administered 2017-12-26 (×5): 1 via OPHTHALMIC

## 2017-12-26 SURGICAL SUPPLY — 18 items
GLOVE BIO SURGEON STRL SZ8 (GLOVE) ×3 IMPLANT
GLOVE BIOGEL M 6.5 STRL (GLOVE) ×3 IMPLANT
GLOVE SURG LX 7.5 STRW (GLOVE) ×2
GLOVE SURG LX STRL 7.5 STRW (GLOVE) ×1 IMPLANT
GOWN STRL REUS W/ TWL LRG LVL3 (GOWN DISPOSABLE) ×2 IMPLANT
GOWN STRL REUS W/TWL LRG LVL3 (GOWN DISPOSABLE) ×4
LABEL CATARACT MEDS ST (LABEL) ×3 IMPLANT
LENS IOL TECNIS ITEC 18.5 (Intraocular Lens) ×2 IMPLANT
NDL HPO THNWL 1X22GA REG BVL (NEEDLE) ×1 IMPLANT
NEEDLE SAFETY 22GX1 (NEEDLE) ×2
PACK CATARACT (MISCELLANEOUS) ×3 IMPLANT
PACK CATARACT BRASINGTON LX (MISCELLANEOUS) ×3 IMPLANT
PACK EYE AFTER SURG (MISCELLANEOUS) ×3 IMPLANT
SOL BSS BAG (MISCELLANEOUS) ×3
SOLUTION BSS BAG (MISCELLANEOUS) ×1 IMPLANT
SYR 5ML LL (SYRINGE) ×3 IMPLANT
WATER STERILE IRR 250ML POUR (IV SOLUTION) ×3 IMPLANT
WIPE NON LINTING 3.25X3.25 (MISCELLANEOUS) ×3 IMPLANT

## 2017-12-26 NOTE — Anesthesia Postprocedure Evaluation (Signed)
Anesthesia Post Note  Patient: Joseph Clements  Procedure(s) Performed: CATARACT EXTRACTION PHACO AND INTRAOCULAR LENS PLACEMENT ([place]) (Left Eye)  Patient location during evaluation: PACU Anesthesia Type: MAC Level of consciousness: awake Pain management: pain level controlled Vital Signs Assessment: post-procedure vital signs reviewed and stable Respiratory status: spontaneous breathing Cardiovascular status: blood pressure returned to baseline Postop Assessment: no apparent nausea or vomiting Anesthetic complications: no     Last Vitals:  Vitals:   12/26/17 0626 12/26/17 0840  BP: (!) 145/80 (!) 161/85  Pulse: 76 76  Resp: 16 16  Temp: (!) 36.2 C 37 C  SpO2: 97% 99%    Last Pain:  Vitals:   12/26/17 0840  TempSrc:   PainSc: 2                  Carron Curie

## 2017-12-26 NOTE — H&P (Signed)
The History and Physical notes are on paper, have been signed, and are to be scanned. The patient remains stable and unchanged from the H&P.   Previous H&P reviewed, patient examined, and there are no changes.  Joseph Clements 12/26/2017 7:27 AM

## 2017-12-26 NOTE — Transfer of Care (Signed)
Immediate Anesthesia Transfer of Care Note  Patient: Joseph Clements  Procedure(s) Performed: CATARACT EXTRACTION PHACO AND INTRAOCULAR LENS PLACEMENT ([facility]) (Left Eye)  Patient Location: PACU  Anesthesia Type:MAC  Level of Consciousness: awake, alert  and oriented  Airway & Oxygen Therapy: Patient Spontanous Breathing  Post-op Assessment: Report given to RN and Post -op Vital signs reviewed and stable  Post vital signs: Reviewed and stable  Last Vitals:  Vitals Value Taken Time  BP 161/85 12/26/2017  8:40 AM  Temp 37 C 12/26/2017  8:40 AM  Pulse 76 12/26/2017  8:40 AM  Resp 16 12/26/2017  8:40 AM  SpO2 99 % 12/26/2017  8:40 AM    Last Pain:  Vitals:   12/26/17 0840  TempSrc:   PainSc: 2          Complications: No apparent anesthesia complications

## 2017-12-26 NOTE — Discharge Instructions (Signed)

## 2017-12-26 NOTE — Anesthesia Preprocedure Evaluation (Addendum)
Anesthesia Evaluation  Patient identified by MRN, date of birth, ID band Patient awake    Reviewed: Allergy & Precautions, H&P , NPO status , reviewed documented beta blocker date and time   Airway Mallampati: III  TM Distance: >3 FB     Dental  (+) Chipped   Pulmonary shortness of breath, former smoker,    Pulmonary exam normal        Cardiovascular Normal cardiovascular exam     Neuro/Psych PSYCHIATRIC DISORDERS Anxiety Depression    GI/Hepatic GERD  ,  Endo/Other  diabetesHypothyroidism   Renal/GU Renal disease     Musculoskeletal  (+) Arthritis ,   Abdominal   Peds  Hematology   Anesthesia Other Findings   Reproductive/Obstetrics                             Anesthesia Physical Anesthesia Plan  ASA: III  Anesthesia Plan: MAC   Post-op Pain Management:    Induction:   PONV Risk Score and Plan: 1 and TIVA  Airway Management Planned:   Additional Equipment:   Intra-op Plan:   Post-operative Plan:   Informed Consent: I have reviewed the patients History and Physical, chart, labs and discussed the procedure including the risks, benefits and alternatives for the proposed anesthesia with the patient or authorized representative who has indicated his/her understanding and acceptance.   Dental Advisory Given  Plan Discussed with: CRNA  Anesthesia Plan Comments:         Anesthesia Quick Evaluation

## 2017-12-26 NOTE — Op Note (Signed)
OPERATIVE NOTE  Joseph Clements [id number] 12/26/2017   PREOPERATIVE DIAGNOSIS:  Nuclear sclerotic cataract left eye. H25.12   POSTOPERATIVE DIAGNOSIS:    Nuclear sclerotic cataract left eye.     PROCEDURE:  Phacoemusification with posterior chamber intraocular lens placement of the left eye   LENS:   Implant Name Type Inv. Item Serial No. Manufacturer Lot No. LRB No. Used  LENS IOL DIOP 18.5 - [id number] Intraocular Lens LENS IOL DIOP 18.5 [id number] AMO  Left 1        ULTRASOUND TIME: 17 % of 1 minutes, 21 seconds.  CDE 13.7   SURGEON:  Wyonia Hough, MD   ANESTHESIA:  Topical with tetracaine drops and 2% Xylocaine jelly, augmented with 1% preservative-free intracameral lidocaine.    COMPLICATIONS:  None.   DESCRIPTION OF PROCEDURE:  The patient was identified in the holding room and transported to the operating room and placed in the supine position under the operating microscope.  The left eye was identified as the operative eye and it was prepped and draped in the usual sterile ophthalmic fashion.   A 1 millimeter clear-corneal paracentesis was made at the 1:30 position. 0.5 ml of preservative-free 1% lidocaine was injected into the anterior chamber.  The anterior chamber was filled with Viscoat viscoelastic.  A 2.4 millimeter keratome was used to make a near-clear corneal incision at the 10:30 position.  .  A curvilinear capsulorrhexis was made with a cystotome and capsulorrhexis forceps.  Balanced salt solution was used to hydrodissect and hydrodelineate the nucleus.   Phacoemulsification was then used in stop and chop fashion to remove the lens nucleus and epinucleus.  The remaining cortex was then removed using the irrigation and aspiration handpiece. Provisc was then placed into the capsular bag to distend it for lens placement.  A lens was then injected into the capsular bag.  The remaining viscoelastic was aspirated.   Wounds were hydrated with balanced salt  solution.  The anterior chamber was inflated to a physiologic pressure with balanced salt solution. Vigamox 0.2 ml of a 1mg  per ml solution was injected into the anterior chamber for a dose of 0.2 mg of intracameral antibiotic at the completion of the case.  Miostat was placed into the anterior chamber to constrict the pupil.  No wound leaks were noted.  Topical Vigamox drops and Maxitrol ointment were applied to the eye.  The patient was taken to the recovery room in stable condition without complications of anesthesia or surgery  [name] 12/26/2017, 8:37 AM

## 2017-12-26 NOTE — Anesthesia Post-op Follow-up Note (Signed)
Anesthesia QCDR form completed.        

## 2018-03-04 ENCOUNTER — Encounter: Payer: Self-pay | Admitting: *Deleted

## 2018-03-07 ENCOUNTER — Encounter
Admission: RE | Disposition: A | Discharge status: Home / Self Care | Payer: Self-pay | Source: Ambulatory Visit | Attending: Ophthalmology

## 2018-03-07 ENCOUNTER — Ambulatory Visit
Admission: RE | Admit: 2018-03-07 | Discharge: 2018-03-07 | Disposition: A | Discharge status: Home / Self Care | Payer: Medicare Other | Source: Ambulatory Visit | Attending: Ophthalmology | Admitting: Ophthalmology

## 2018-03-07 ENCOUNTER — Ambulatory Visit: Payer: Medicare Other | Admitting: Anesthesiology

## 2018-03-07 DIAGNOSIS — Z91013 Allergy to seafood: Secondary | ICD-10-CM | POA: Insufficient documentation

## 2018-03-07 DIAGNOSIS — E1136 Type 2 diabetes mellitus with diabetic cataract: Secondary | ICD-10-CM | POA: Insufficient documentation

## 2018-03-07 DIAGNOSIS — E039 Hypothyroidism, unspecified: Secondary | ICD-10-CM | POA: Insufficient documentation

## 2018-03-07 DIAGNOSIS — Z79899 Other long term (current) drug therapy: Secondary | ICD-10-CM | POA: Insufficient documentation

## 2018-03-07 DIAGNOSIS — K219 Gastro-esophageal reflux disease without esophagitis: Secondary | ICD-10-CM | POA: Diagnosis not present

## 2018-03-07 DIAGNOSIS — H2511 Age-related nuclear cataract, right eye: Secondary | ICD-10-CM | POA: Insufficient documentation

## 2018-03-07 DIAGNOSIS — K589 Irritable bowel syndrome without diarrhea: Secondary | ICD-10-CM | POA: Insufficient documentation

## 2018-03-07 DIAGNOSIS — Z7984 Long term (current) use of oral hypoglycemic drugs: Secondary | ICD-10-CM | POA: Diagnosis not present

## 2018-03-07 DIAGNOSIS — Z9842 Cataract extraction status, left eye: Secondary | ICD-10-CM | POA: Diagnosis not present

## 2018-03-07 DIAGNOSIS — Z87891 Personal history of nicotine dependence: Secondary | ICD-10-CM | POA: Diagnosis not present

## 2018-03-07 DIAGNOSIS — F329 Major depressive disorder, single episode, unspecified: Secondary | ICD-10-CM | POA: Diagnosis not present

## 2018-03-07 HISTORY — PX: CATARACT EXTRACTION W/PHACO: SHX586

## 2018-03-07 LAB — GLUCOSE, CAPILLARY: GLUCOSE-CAPILLARY: 120 mg/dL — AB (ref 70–99)

## 2018-03-07 SURGERY — PHACOEMULSIFICATION, CATARACT, WITH IOL INSERTION
Anesthesia: Monitor Anesthesia Care | Site: Eye | Laterality: Right | Wound class: Clean

## 2018-03-07 MED ORDER — NEOMYCIN-POLYMYXIN-DEXAMETH 3.5-10000-0.1 OP OINT
TOPICAL_OINTMENT | OPHTHALMIC | Status: AC
  Filled 2018-03-07: qty 3.5

## 2018-03-07 MED ORDER — MOXIFLOXACIN HCL 0.5 % OP SOLN
OPHTHALMIC | Status: AC
  Administered 2018-03-07: 1 [drp] via OPHTHALMIC
  Filled 2018-03-07: qty 3

## 2018-03-07 MED ORDER — NA HYALUR & NA CHOND-NA HYALUR 0.55-0.5 ML IO KIT
PACK | INTRAOCULAR | Status: AC
  Filled 2018-03-07: qty 1.05

## 2018-03-07 MED ORDER — NEOMYCIN-POLYMYXIN-DEXAMETH 0.1 % OP OINT
TOPICAL_OINTMENT | OPHTHALMIC | Status: DC | PRN
  Administered 2018-03-07: 1 via OPHTHALMIC

## 2018-03-07 MED ORDER — EPINEPHRINE PF 1 MG/ML IJ SOLN
INTRAMUSCULAR | Status: AC
  Filled 2018-03-07: qty 1

## 2018-03-07 MED ORDER — POVIDONE-IODINE 5 % OP SOLN
OPHTHALMIC | Status: DC | PRN
  Administered 2018-03-07: 1 via OPHTHALMIC

## 2018-03-07 MED ORDER — NA HYALUR & NA CHOND-NA HYALUR 0.4-0.35 ML IO KIT
PACK | INTRAOCULAR | Status: DC | PRN
  Administered 2018-03-07: .35 mL via INTRAOCULAR

## 2018-03-07 MED ORDER — FENTANYL CITRATE (PF) 100 MCG/2ML IJ SOLN
INTRAMUSCULAR | Status: AC
  Filled 2018-03-07: qty 2

## 2018-03-07 MED ORDER — CARBACHOL 0.01 % IO SOLN
INTRAOCULAR | Status: DC | PRN
  Administered 2018-03-07: 0.5 mL via INTRAOCULAR

## 2018-03-07 MED ORDER — MOXIFLOXACIN HCL 0.5 % OP SOLN
1.0000 [drp] | OPHTHALMIC | Status: AC
  Administered 2018-03-07 (×3): 1 [drp] via OPHTHALMIC

## 2018-03-07 MED ORDER — LIDOCAINE HCL (PF) 4 % IJ SOLN
INTRAOCULAR | Status: DC | PRN
  Administered 2018-03-07: 1 mL via OPHTHALMIC

## 2018-03-07 MED ORDER — MIDAZOLAM HCL 2 MG/2ML IJ SOLN
INTRAMUSCULAR | Status: AC
  Filled 2018-03-07: qty 2

## 2018-03-07 MED ORDER — EPINEPHRINE PF 1 MG/ML IJ SOLN
INTRAOCULAR | Status: DC | PRN
  Administered 2018-03-07: 250 mL via OPHTHALMIC

## 2018-03-07 MED ORDER — CYCLOPENTOLATE HCL 2 % OP SOLN
1.0000 [drp] | OPHTHALMIC | Status: AC
  Administered 2018-03-07 (×4): 1 [drp] via OPHTHALMIC

## 2018-03-07 MED ORDER — LIDOCAINE HCL (PF) 4 % IJ SOLN
INTRAMUSCULAR | Status: AC
  Filled 2018-03-07: qty 5

## 2018-03-07 MED ORDER — PHENYLEPHRINE HCL 10 % OP SOLN
1.0000 [drp] | OPHTHALMIC | Status: AC
  Administered 2018-03-07 (×4): 1 [drp] via OPHTHALMIC

## 2018-03-07 MED ORDER — TETRACAINE HCL 0.5 % OP SOLN
OPHTHALMIC | Status: AC
  Administered 2018-03-07: 1 [drp] via OPHTHALMIC
  Filled 2018-03-07: qty 4

## 2018-03-07 MED ORDER — POVIDONE-IODINE 5 % OP SOLN
OPHTHALMIC | Status: AC
  Filled 2018-03-07: qty 30

## 2018-03-07 MED ORDER — TETRACAINE HCL 0.5 % OP SOLN
1.0000 [drp] | OPHTHALMIC | Status: AC | PRN
  Administered 2018-03-07 (×2): 1 [drp] via OPHTHALMIC

## 2018-03-07 MED ORDER — PHENYLEPHRINE HCL 10 % OP SOLN
OPHTHALMIC | Status: AC
  Administered 2018-03-07: 1 [drp] via OPHTHALMIC
  Filled 2018-03-07: qty 5

## 2018-03-07 MED ORDER — MIDAZOLAM HCL 2 MG/2ML IJ SOLN
INTRAMUSCULAR | Status: DC | PRN
  Administered 2018-03-07: 1 mg via INTRAVENOUS

## 2018-03-07 MED ORDER — FENTANYL CITRATE (PF) 100 MCG/2ML IJ SOLN
INTRAMUSCULAR | Status: DC | PRN
  Administered 2018-03-07 (×2): 25 ug via INTRAVENOUS

## 2018-03-07 MED ORDER — SODIUM CHLORIDE 0.9 % IV SOLN
INTRAVENOUS | Status: DC
  Administered 2018-03-07: 08:00:00 via INTRAVENOUS

## 2018-03-07 MED ORDER — CYCLOPENTOLATE HCL 2 % OP SOLN
OPHTHALMIC | Status: AC
  Administered 2018-03-07: 1 [drp] via OPHTHALMIC
  Filled 2018-03-07: qty 2

## 2018-03-07 SURGICAL SUPPLY — 18 items
GLOVE BIO SURGEON STRL SZ8 (GLOVE) ×3 IMPLANT
GLOVE BIOGEL M 6.5 STRL (GLOVE) ×3 IMPLANT
GLOVE SURG LX 7.5 STRW (GLOVE) ×2
GLOVE SURG LX STRL 7.5 STRW (GLOVE) ×1 IMPLANT
GOWN STRL REUS W/ TWL LRG LVL3 (GOWN DISPOSABLE) ×2 IMPLANT
GOWN STRL REUS W/TWL LRG LVL3 (GOWN DISPOSABLE) ×4
LABEL CATARACT MEDS ST (LABEL) ×3 IMPLANT
LENS IOL TECNIS ITEC 18.5 (Intraocular Lens) ×3 IMPLANT
NDL HPO THNWL 1X22GA REG BVL (NEEDLE) ×1 IMPLANT
NEEDLE SAFETY 22GX1 (NEEDLE) ×2
PACK CATARACT (MISCELLANEOUS) ×3 IMPLANT
PACK CATARACT BRASINGTON LX (MISCELLANEOUS) ×3 IMPLANT
PACK EYE AFTER SURG (MISCELLANEOUS) ×3 IMPLANT
SOL BSS BAG (MISCELLANEOUS) ×3
SOLUTION BSS BAG (MISCELLANEOUS) ×1 IMPLANT
SYR 5ML LL (SYRINGE) ×3 IMPLANT
WATER STERILE IRR 250ML POUR (IV SOLUTION) ×3 IMPLANT
WIPE NON LINTING 3.25X3.25 (MISCELLANEOUS) ×3 IMPLANT

## 2018-03-07 NOTE — H&P (Signed)
The History and Physical notes are on paper, have been signed, and are to be scanned. The patient remains stable and unchanged from the H&P.   Previous H&P reviewed, patient examined, and there are no changes.  Joseph Clements 03/07/2018 7:32 AM

## 2018-03-07 NOTE — Op Note (Signed)
OPERATIVE NOTE  RAMONA SLINGER [id number] 03/07/2018   PREOPERATIVE DIAGNOSIS:  Nuclear Sclerotic Cataract Right Eye H25.11   POSTOPERATIVE DIAGNOSIS: Nuclear Sclerotic Cataract Right Eye H25.11          PROCEDURE:  Phacoemusification with posterior chamber intraocular lens placement of the right eye   LENS:   Implant Name Type Inv. Item Serial No. Manufacturer Lot No. LRB No. Used  LENS IOL DIOP 18.5 - [id number] Intraocular Lens LENS IOL DIOP 18.5 [id number] AMO  Right 1       ULTRASOUND TIME: 9 %  of 1 minutes 13 seconds, CDE 6.6  SURGEON:  Wyonia Hough, MD   ANESTHESIA:  Topical with tetracaine drops and 2% Xylocaine jelly, augmented with 1% preservative-free intracameral lidocaine.    COMPLICATIONS:  None.   DESCRIPTION OF PROCEDURE:  The patient was identified in the holding room and transported to the operating room and placed in the supine position under the operating microscope. Theright eye was identified as the operative eye and it was prepped and draped in the usual sterile ophthalmic fashion.   A 1 millimeter clear-corneal paracentesis was made at the 12:00 position.  0.5 ml of preservative-free 1% lidocaine was injected into the anterior chamber. The anterior chamber was filled with Viscoat viscoelastic.  A 2.4 millimeter keratome was used to make a near-clear corneal incision at the 9:00 position. A curvilinear capsulorrhexis was made with a cystotome and capsulorrhexis forceps.  Balanced salt solution was used to hydrodissect and hydrodelineate the nucleus.   Phacoemulsification was then used in stop and chop fashion to remove the lens nucleus and epinucleus.  The remaining cortex was then removed using the irrigation and aspiration handpiece. Provisc was then placed into the capsular bag to distend it for lens placement.  A lens was then injected into the capsular bag.  The remaining viscoelastic was aspirated.  Wounds were hydrated with balanced salt  solution.  The anterior chamber was inflated to a physiologic pressure with balanced salt solution. Vigamox 0.2 ml of a 1mg  per ml solution was injected into the anterior chamber for a dose of 0.2 mg of intracameral antibiotic at the completion of the case. Miostat was placed into the anterior chamber to constrict the pupil.  No wound leaks were noted.  Topical  Maxitrol ointment was applied to the eye.  The patient was taken to the recovery room in stable condition without complications of anesthesia or surgery.  Zoie Sarin 03/07/2018, 8:03 AM

## 2018-03-07 NOTE — Transfer of Care (Signed)
Immediate Anesthesia Transfer of Care Note  Patient: Joseph Clements  Procedure(s) Performed: CATARACT EXTRACTION PHACO AND INTRAOCULAR LENS PLACEMENT ([facility]) (Right Eye)  Patient Location: PACU  Anesthesia Type:MAC  Level of Consciousness: awake, alert  and oriented  Airway & Oxygen Therapy: Patient Spontanous Breathing  Post-op Assessment: Report given to RN and Post -op Vital signs reviewed and stable  Post vital signs: Reviewed and stable  Last Vitals:  Vitals Value Taken Time  BP    Temp    Pulse    Resp    SpO2      Last Pain:  Vitals:   03/07/18 0613  TempSrc: Oral         Complications: No apparent anesthesia complications

## 2018-03-07 NOTE — Anesthesia Post-op Follow-up Note (Signed)
Anesthesia QCDR form completed.        

## 2018-03-07 NOTE — Anesthesia Postprocedure Evaluation (Signed)
Anesthesia Post Note  Patient: Joseph Clements  Procedure(s) Performed: CATARACT EXTRACTION PHACO AND INTRAOCULAR LENS PLACEMENT ([place]) (Right Eye)  Patient location during evaluation: PACU Anesthesia Type: MAC Level of consciousness: awake, awake and alert and oriented Pain management: pain level controlled Vital Signs Assessment: post-procedure vital signs reviewed and stable Respiratory status: spontaneous breathing Cardiovascular status: blood pressure returned to baseline Postop Assessment: no headache Anesthetic complications: no     Last Vitals:  Vitals:   03/07/18 0613  BP: (!) 162/96  Pulse: 86  Resp: 16  Temp: 36.4 C  SpO2: 96%    Last Pain:  Vitals:   03/07/18 8144  TempSrc: Oral                 Philbert Riser

## 2018-03-07 NOTE — Discharge Instructions (Signed)
Eye Surgery Discharge Instructions    Expect mild scratchy sensation or mild soreness. DO NOT RUB YOUR EYE!  The day of surgery:  Minimal physical activity, but bed rest is not required  No reading, computer work, or close hand work  No bending, lifting, or straining.  May watch TV  For 24 hours:  No driving, legal decisions, or alcoholic beverages  Safety precautions  Eat anything you prefer: It is better to start with liquids, then soup then solid foods.  _____ Eye patch should be worn until postoperative exam tomorrow.  ____ Solar shield eyeglasses should be worn for comfort in the sunlight/patch while sleeping  Resume all regular medications including aspirin or Coumadin if these were discontinued prior to surgery. You may shower, bathe, shave, or wash your hair. Tylenol may be taken for mild discomfort.  Call your doctor if you experience significant pain, nausea, or vomiting, fever > 101 or other signs of infection. [phone] or [phone] Specific instructions:  Follow-up Information    Leandrew Koyanagi, MD Follow up.   Specialty:  Ophthalmology Why:  03/07/18 at 3:15 Contact information: [place] [address] [phone]          Eye Surgery Discharge Instructions    Expect mild scratchy sensation or mild soreness. DO NOT RUB YOUR EYE!  The day of surgery:  Minimal physical activity, but bed rest is not required  No reading, computer work, or close hand work  No bending, lifting, or straining.  May watch TV  For 24 hours:  No driving, legal decisions, or alcoholic beverages  Safety precautions  Eat anything you prefer: It is better to start with liquids, then soup then solid foods.  _____ Eye patch should be worn until postoperative exam tomorrow.  ____ Solar shield eyeglasses should be worn for comfort in the sunlight/patch while sleeping  Resume all regular medications including aspirin or Coumadin  if these were discontinued prior to surgery. You may shower, bathe, shave, or wash your hair. Tylenol may be taken for mild discomfort.  Call your doctor if you experience significant pain, nausea, or vomiting, fever > 101 or other signs of infection. [phone] or [phone] Specific instructions:  Follow-up Information    Leandrew Koyanagi, MD Follow up.   Specialty:  Ophthalmology Why:  03/07/18 at 3:15 Contact information: [address]   [place] [address] [phone]

## 2018-03-07 NOTE — Anesthesia Preprocedure Evaluation (Addendum)
Anesthesia Evaluation  Patient identified by MRN, date of birth, ID band Patient awake    Reviewed: Allergy & Precautions, H&P , NPO status , reviewed documented beta blocker date and time   Airway Mallampati: III  TM Distance: >3 FB Neck ROM: full    Dental no notable dental hx.    Pulmonary shortness of breath and with exertion, neg COPD, neg recent URI, former smoker,    Pulmonary exam normal        Cardiovascular (-) Past MI, (-) Cardiac Stents, (-) CABG and (-) CHF Normal cardiovascular exam     Neuro/Psych PSYCHIATRIC DISORDERS Anxiety Depression    GI/Hepatic GERD  ,  Endo/Other  diabetes, Type 2Hypothyroidism   Renal/GU Renal disease     Musculoskeletal  (+) Arthritis ,   Abdominal   Peds  Hematology   Anesthesia Other Findings   Reproductive/Obstetrics                           Anesthesia Physical  Anesthesia Plan  ASA: III  Anesthesia Plan: MAC   Post-op Pain Management:    Induction:   PONV Risk Score and Plan: 1 and Midazolam and TIVA  Airway Management Planned: Natural Airway  Additional Equipment:   Intra-op Plan:   Post-operative Plan:   Informed Consent: I have reviewed the patients History and Physical, chart, labs and discussed the procedure including the risks, benefits and alternatives for the proposed anesthesia with the patient or authorized representative who has indicated his/her understanding and acceptance.   Dental Advisory Given  Plan Discussed with: CRNA and Anesthesiologist  Anesthesia Plan Comments:         Anesthesia Quick Evaluation

## 2018-05-24 ENCOUNTER — Other Ambulatory Visit: Payer: Self-pay

## 2018-05-24 ENCOUNTER — Emergency Department: Payer: Medicare Other

## 2018-05-24 ENCOUNTER — Inpatient Hospital Stay
Admission: EM | Admit: 2018-05-24 | Discharge: 2018-05-28 | DRG: 418 | Disposition: A | Discharge status: Home / Self Care | Payer: Medicare Other | Attending: Surgery | Admitting: Surgery

## 2018-05-24 ENCOUNTER — Encounter: Payer: Self-pay | Admitting: Emergency Medicine

## 2018-05-24 DIAGNOSIS — K82A1 Gangrene of gallbladder in cholecystitis: Secondary | ICD-10-CM | POA: Diagnosis present

## 2018-05-24 DIAGNOSIS — Z91013 Allergy to seafood: Secondary | ICD-10-CM | POA: Diagnosis not present

## 2018-05-24 DIAGNOSIS — F329 Major depressive disorder, single episode, unspecified: Secondary | ICD-10-CM | POA: Diagnosis present

## 2018-05-24 DIAGNOSIS — K810 Acute cholecystitis: Secondary | ICD-10-CM | POA: Diagnosis present

## 2018-05-24 DIAGNOSIS — K219 Gastro-esophageal reflux disease without esophagitis: Secondary | ICD-10-CM | POA: Diagnosis present

## 2018-05-24 DIAGNOSIS — M1990 Unspecified osteoarthritis, unspecified site: Secondary | ICD-10-CM | POA: Diagnosis present

## 2018-05-24 DIAGNOSIS — T508X5A Adverse effect of diagnostic agents, initial encounter: Secondary | ICD-10-CM | POA: Diagnosis not present

## 2018-05-24 DIAGNOSIS — F419 Anxiety disorder, unspecified: Secondary | ICD-10-CM | POA: Diagnosis present

## 2018-05-24 DIAGNOSIS — E039 Hypothyroidism, unspecified: Secondary | ICD-10-CM | POA: Diagnosis present

## 2018-05-24 DIAGNOSIS — Z85820 Personal history of malignant melanoma of skin: Secondary | ICD-10-CM

## 2018-05-24 DIAGNOSIS — R7989 Other specified abnormal findings of blood chemistry: Secondary | ICD-10-CM

## 2018-05-24 DIAGNOSIS — R1011 Right upper quadrant pain: Secondary | ICD-10-CM | POA: Diagnosis present

## 2018-05-24 DIAGNOSIS — F4310 Post-traumatic stress disorder, unspecified: Secondary | ICD-10-CM | POA: Diagnosis present

## 2018-05-24 DIAGNOSIS — K5792 Diverticulitis of intestine, part unspecified, without perforation or abscess without bleeding: Secondary | ICD-10-CM | POA: Diagnosis present

## 2018-05-24 DIAGNOSIS — Z87891 Personal history of nicotine dependence: Secondary | ICD-10-CM

## 2018-05-24 DIAGNOSIS — Z9842 Cataract extraction status, left eye: Secondary | ICD-10-CM

## 2018-05-24 DIAGNOSIS — N400 Enlarged prostate without lower urinary tract symptoms: Secondary | ICD-10-CM | POA: Diagnosis present

## 2018-05-24 DIAGNOSIS — E119 Type 2 diabetes mellitus without complications: Secondary | ICD-10-CM | POA: Diagnosis present

## 2018-05-24 DIAGNOSIS — R945 Abnormal results of liver function studies: Secondary | ICD-10-CM

## 2018-05-24 DIAGNOSIS — Z87442 Personal history of urinary calculi: Secondary | ICD-10-CM | POA: Diagnosis not present

## 2018-05-24 DIAGNOSIS — Z7984 Long term (current) use of oral hypoglycemic drugs: Secondary | ICD-10-CM

## 2018-05-24 DIAGNOSIS — N179 Acute kidney failure, unspecified: Secondary | ICD-10-CM | POA: Diagnosis not present

## 2018-05-24 DIAGNOSIS — Z961 Presence of intraocular lens: Secondary | ICD-10-CM | POA: Diagnosis present

## 2018-05-24 DIAGNOSIS — Z9841 Cataract extraction status, right eye: Secondary | ICD-10-CM | POA: Diagnosis not present

## 2018-05-24 DIAGNOSIS — K660 Peritoneal adhesions (postprocedural) (postinfection): Secondary | ICD-10-CM | POA: Diagnosis present

## 2018-05-24 DIAGNOSIS — E785 Hyperlipidemia, unspecified: Secondary | ICD-10-CM | POA: Diagnosis present

## 2018-05-24 DIAGNOSIS — R109 Unspecified abdominal pain: Secondary | ICD-10-CM

## 2018-05-24 LAB — COMPREHENSIVE METABOLIC PANEL
ALT: 50 U/L — ABNORMAL HIGH (ref 0–44)
AST: 20 U/L (ref 15–41)
Albumin: 4.3 g/dL (ref 3.5–5.0)
Alkaline Phosphatase: 129 U/L — ABNORMAL HIGH (ref 38–126)
Anion gap: 9 (ref 5–15)
BUN: 16 mg/dL (ref 8–23)
CALCIUM: 9.4 mg/dL (ref 8.9–10.3)
CO2: 27 mmol/L (ref 22–32)
CREATININE: 1.09 mg/dL (ref 0.61–1.24)
Chloride: 100 mmol/L (ref 98–111)
GFR calc non Af Amer: >60 mL/min (ref >60)
Glucose, Bld: 182 mg/dL — ABNORMAL HIGH (ref 70–99)
Potassium: 4.3 mmol/L (ref 3.5–5.1)
SODIUM: 136 mmol/L (ref 135–145)
Total Bilirubin: 0.9 mg/dL (ref 0.3–1.2)
Total Protein: 7.5 g/dL (ref 6.5–8.1)

## 2018-05-24 LAB — URINALYSIS, COMPLETE (UACMP) WITH MICROSCOPIC
Bacteria, UA: NONE SEEN
Bilirubin Urine: NEGATIVE
GLUCOSE, UA: NEGATIVE mg/dL
Ketones, ur: 5 mg/dL — AB
Leukocytes, UA: NEGATIVE
Nitrite: NEGATIVE
PH: 5 (ref 5.0–8.0)
Protein, ur: 100 mg/dL — AB
SPECIFIC GRAVITY, URINE: 1.02 (ref 1.005–1.030)

## 2018-05-24 LAB — LIPASE, BLOOD: Lipase: 21 U/L (ref 11–51)

## 2018-05-24 LAB — CBC
HCT: 42.4 % (ref 40.0–52.0)
Hemoglobin: 14.6 g/dL (ref 13.0–18.0)
MCH: 30.2 pg (ref 26.0–34.0)
MCHC: 34.4 g/dL (ref 32.0–36.0)
MCV: 87.9 fL (ref 80.0–100.0)
PLATELETS: 293 10*3/uL (ref 150–440)
RBC: 4.82 MIL/uL (ref 4.40–5.90)
RDW: 13.6 % (ref 11.5–14.5)
WBC: 10.3 10*3/uL (ref 3.8–10.6)

## 2018-05-24 LAB — GLUCOSE, CAPILLARY: Glucose-Capillary: 196 mg/dL — ABNORMAL HIGH (ref 70–99)

## 2018-05-24 MED ORDER — DOCUSATE SODIUM 100 MG PO CAPS: 100.0000 mg | ORAL_CAPSULE | Freq: Two times a day (BID) | ORAL | Status: DC | PRN

## 2018-05-24 MED ORDER — CIPROFLOXACIN IN D5W 400 MG/200ML IV SOLN
400.0000 mg | Freq: Two times a day (BID) | INTRAVENOUS | Status: DC
  Administered 2018-05-25 – 2018-05-26 (×4): 400 mg via INTRAVENOUS
  Filled 2018-05-24 (×6): qty 200

## 2018-05-24 MED ORDER — ONDANSETRON 4 MG PO TBDP: 4.0000 mg | ORAL_TABLET | Freq: Four times a day (QID) | ORAL | Status: DC | PRN

## 2018-05-24 MED ORDER — FAMOTIDINE IN NACL 20-0.9 MG/50ML-% IV SOLN
20.0000 mg | Freq: Two times a day (BID) | INTRAVENOUS | Status: DC
  Administered 2018-05-24 – 2018-05-27 (×6): 20 mg via INTRAVENOUS
  Filled 2018-05-24 (×6): qty 50

## 2018-05-24 MED ORDER — MORPHINE SULFATE (PF) 4 MG/ML IV SOLN
4.0000 mg | Freq: Once | INTRAVENOUS | Status: AC
  Administered 2018-05-24: 4 mg via INTRAVENOUS
  Filled 2018-05-24: qty 1

## 2018-05-24 MED ORDER — MORPHINE SULFATE (PF) 2 MG/ML IV SOLN
2.0000 mg | INTRAVENOUS | Status: DC | PRN
  Administered 2018-05-24 – 2018-05-25 (×4): 2 mg via INTRAVENOUS
  Filled 2018-05-24 (×4): qty 1

## 2018-05-24 MED ORDER — HYDROCODONE-ACETAMINOPHEN 5-325 MG PO TABS
1.0000 | ORAL_TABLET | ORAL | Status: DC | PRN
  Administered 2018-05-26 – 2018-05-27 (×3): 2 via ORAL
  Filled 2018-05-24 (×3): qty 2

## 2018-05-24 MED ORDER — CIPROFLOXACIN IN D5W 400 MG/200ML IV SOLN
400.0000 mg | Freq: Once | INTRAVENOUS | Status: AC
  Administered 2018-05-24: 400 mg via INTRAVENOUS
  Filled 2018-05-24: qty 200

## 2018-05-24 MED ORDER — INSULIN ASPART 100 UNIT/ML ~~LOC~~ SOLN
0.0000 [IU] | Freq: Three times a day (TID) | SUBCUTANEOUS | Status: DC
  Administered 2018-05-25 (×2): 5 [IU] via SUBCUTANEOUS
  Administered 2018-05-25 – 2018-05-26 (×2): 3 [IU] via SUBCUTANEOUS
  Administered 2018-05-26: 5 [IU] via SUBCUTANEOUS
  Administered 2018-05-27 – 2018-05-28 (×2): 2 [IU] via SUBCUTANEOUS
  Filled 2018-05-24 (×7): qty 1

## 2018-05-24 MED ORDER — METRONIDAZOLE IN NACL 5-0.79 MG/ML-% IV SOLN
500.0000 mg | Freq: Three times a day (TID) | INTRAVENOUS | Status: DC
  Administered 2018-05-25 – 2018-05-27 (×7): 500 mg via INTRAVENOUS
  Filled 2018-05-24 (×10): qty 100

## 2018-05-24 MED ORDER — ONDANSETRON HCL 4 MG/2ML IJ SOLN
4.0000 mg | Freq: Four times a day (QID) | INTRAMUSCULAR | Status: DC | PRN
  Administered 2018-05-24: 4 mg via INTRAVENOUS
  Filled 2018-05-24: qty 2

## 2018-05-24 MED ORDER — ONDANSETRON HCL 4 MG/2ML IJ SOLN
4.0000 mg | Freq: Once | INTRAMUSCULAR | Status: AC
  Administered 2018-05-24: 4 mg via INTRAVENOUS
  Filled 2018-05-24: qty 2

## 2018-05-24 MED ORDER — LACTATED RINGERS IV SOLN
INTRAVENOUS | Status: DC
  Administered 2018-05-24 – 2018-05-25 (×2): via INTRAVENOUS

## 2018-05-24 MED ORDER — TRAMADOL HCL 50 MG PO TABS: 50.0000 mg | ORAL_TABLET | Freq: Four times a day (QID) | ORAL | Status: DC | PRN

## 2018-05-24 MED ORDER — METRONIDAZOLE IN NACL 5-0.79 MG/ML-% IV SOLN
500.0000 mg | Freq: Once | INTRAVENOUS | Status: AC
  Administered 2018-05-24: 500 mg via INTRAVENOUS
  Filled 2018-05-24: qty 100
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
## 2018-05-24 NOTE — ED Notes (Signed)
Patient transported to Ultrasound 

## 2018-05-24 NOTE — ED Notes (Signed)
Pt back from US

## 2018-05-24 NOTE — ED Triage Notes (Signed)
Pt comes into the ED via [facility] for RUQ abdominal pain.  Patient states he still has a gallbladder and appendix and has guarding on palpation.  Patient states he has N/V/D.  Patient states the pain started last night and he states he has a h/o stomach problems.  Patient had an upper GI completed last year.  Patient has even and unlabored respirations at this time and in NAD.
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
## 2018-05-25 ENCOUNTER — Inpatient Hospital Stay: Payer: Medicare Other | Admitting: Anesthesiology

## 2018-05-25 ENCOUNTER — Encounter
Admission: EM | Disposition: A | Discharge status: Home / Self Care | Payer: Self-pay | Source: Home / Self Care | Attending: Surgery

## 2018-05-25 HISTORY — PX: CHOLECYSTECTOMY: SHX55

## 2018-05-25 LAB — HEPATIC FUNCTION PANEL
ALT: 56 U/L — AB (ref 0–44)
AST: 25 U/L (ref 15–41)
Albumin: 4 g/dL (ref 3.5–5.0)
Alkaline Phosphatase: 129 U/L — ABNORMAL HIGH (ref 38–126)
BILIRUBIN DIRECT: 0.2 mg/dL (ref 0.0–0.2)
Indirect Bilirubin: 0.9 mg/dL (ref 0.3–0.9)
TOTAL PROTEIN: 7.7 g/dL (ref 6.5–8.1)
Total Bilirubin: 1.1 mg/dL (ref 0.3–1.2)

## 2018-05-25 LAB — CBC
HCT: 44.5 % (ref 40.0–52.0)
HEMOGLOBIN: 15.2 g/dL (ref 13.0–18.0)
MCH: 30.3 pg (ref 26.0–34.0)
MCHC: 34.1 g/dL (ref 32.0–36.0)
MCV: 89 fL (ref 80.0–100.0)
PLATELETS: 255 10*3/uL (ref 150–440)
RBC: 5 MIL/uL (ref 4.40–5.90)
RDW: 13.4 % (ref 11.5–14.5)
WBC: 14.1 10*3/uL — ABNORMAL HIGH (ref 3.8–10.6)

## 2018-05-25 LAB — BASIC METABOLIC PANEL
Anion gap: 11 (ref 5–15)
BUN: 12 mg/dL (ref 8–23)
CHLORIDE: 95 mmol/L — AB (ref 98–111)
CO2: 25 mmol/L (ref 22–32)
Calcium: 9.2 mg/dL (ref 8.9–10.3)
Creatinine, Ser: 0.93 mg/dL (ref 0.61–1.24)
GFR calc Af Amer: >60 mL/min (ref >60)
GFR calc non Af Amer: >60 mL/min (ref >60)
GLUCOSE: 235 mg/dL — AB (ref 70–99)
POTASSIUM: 3.7 mmol/L (ref 3.5–5.1)
Sodium: 131 mmol/L — ABNORMAL LOW (ref 135–145)

## 2018-05-25 LAB — GLUCOSE, CAPILLARY
GLUCOSE-CAPILLARY: 230 mg/dL — AB (ref 70–99)
GLUCOSE-CAPILLARY: 194 mg/dL — AB (ref 70–99)
Glucose-Capillary: 244 mg/dL — ABNORMAL HIGH (ref 70–99)

## 2018-05-25 LAB — PHOSPHORUS: Phosphorus: 2.9 mg/dL (ref 2.5–4.6)

## 2018-05-25 LAB — SURGICAL PCR SCREEN
MRSA, PCR: NEGATIVE
Staphylococcus aureus: NEGATIVE

## 2018-05-25 LAB — MAGNESIUM: Magnesium: 1.7 mg/dL (ref 1.7–2.4)

## 2018-05-25 SURGERY — LAPAROSCOPIC CHOLECYSTECTOMY
Anesthesia: General

## 2018-05-25 MED ORDER — LIDOCAINE-EPINEPHRINE (PF) 1 %-1:200000 IJ SOLN
INTRAMUSCULAR | Status: DC | PRN
  Administered 2018-05-25: 12 mL

## 2018-05-25 MED ORDER — SUGAMMADEX SODIUM 200 MG/2ML IV SOLN: INTRAVENOUS | Status: DC | PRN

## 2018-05-25 MED ORDER — LIDOCAINE HCL (CARDIAC) PF 100 MG/5ML IV SOSY
PREFILLED_SYRINGE | INTRAVENOUS | Status: DC | PRN
  Administered 2018-05-25: 100 mg via INTRAVENOUS

## 2018-05-25 MED ORDER — BUPIVACAINE HCL (PF) 0.5 % IJ SOLN
INTRAMUSCULAR | Status: AC
  Filled 2018-05-25: qty 30

## 2018-05-25 MED ORDER — LIDOCAINE-EPINEPHRINE (PF) 1 %-1:200000 IJ SOLN
INTRAMUSCULAR | Status: AC
  Filled 2018-05-25: qty 30

## 2018-05-25 MED ORDER — SUGAMMADEX SODIUM 200 MG/2ML IV SOLN
INTRAVENOUS | Status: DC | PRN
  Administered 2018-05-25: 150 mg via INTRAVENOUS

## 2018-05-25 MED ORDER — PHENYLEPHRINE HCL 10 MG/ML IJ SOLN
INTRAMUSCULAR | Status: DC | PRN
  Administered 2018-05-25 (×2): 100 ug via INTRAVENOUS
  Administered 2018-05-25 (×2): 200 ug via INTRAVENOUS
  Administered 2018-05-25 (×2): 100 ug via INTRAVENOUS

## 2018-05-25 MED ORDER — HYDROMORPHONE HCL 1 MG/ML IJ SOLN
INTRAMUSCULAR | Status: DC | PRN
  Administered 2018-05-25: 1 mg via INTRAVENOUS

## 2018-05-25 MED ORDER — EPHEDRINE SULFATE 50 MG/ML IJ SOLN
INTRAMUSCULAR | Status: DC | PRN
  Administered 2018-05-25: 10 mg via INTRAVENOUS
  Administered 2018-05-25: 15 mg via INTRAVENOUS

## 2018-05-25 MED ORDER — DEXAMETHASONE SODIUM PHOSPHATE 10 MG/ML IJ SOLN
INTRAMUSCULAR | Status: DC | PRN
  Administered 2018-05-25: 10 mg via INTRAVENOUS

## 2018-05-25 MED ORDER — PROPOFOL 10 MG/ML IV BOLUS
INTRAVENOUS | Status: DC | PRN
  Administered 2018-05-25: 170 mg via INTRAVENOUS

## 2018-05-25 MED ORDER — FENTANYL CITRATE (PF) 100 MCG/2ML IJ SOLN
INTRAMUSCULAR | Status: DC | PRN
  Administered 2018-05-25: 100 ug via INTRAVENOUS
  Administered 2018-05-25 (×2): 50 ug via INTRAVENOUS

## 2018-05-25 MED ORDER — ROCURONIUM BROMIDE 100 MG/10ML IV SOLN
INTRAVENOUS | Status: DC | PRN
  Administered 2018-05-25: 50 mg via INTRAVENOUS

## 2018-05-25 MED ORDER — ONDANSETRON HCL 4 MG/2ML IJ SOLN
INTRAMUSCULAR | Status: DC | PRN
  Administered 2018-05-25: 4 mg via INTRAVENOUS

## 2018-05-25 SURGICAL SUPPLY — 61 items
APPLICATOR COTTON TIP 6 STRL (MISCELLANEOUS) IMPLANT
APPLICATOR COTTON TIP 6IN STRL (MISCELLANEOUS)
APPLIER CLIP 5 13 M/L LIGAMAX5 (MISCELLANEOUS) ×3
BLADE SURG SZ11 CARB STEEL (BLADE) ×3 IMPLANT
BULB RESERV EVAC DRAIN JP 100C (MISCELLANEOUS) ×3 IMPLANT
CANISTER SUCT 1200ML W/VALVE (MISCELLANEOUS) ×3 IMPLANT
CHLORAPREP W/TINT 26ML (MISCELLANEOUS) ×3 IMPLANT
CHOLANGIOGRAM CATH TAUT (CATHETERS) IMPLANT
CLIP APPLIE 5 13 M/L LIGAMAX5 (MISCELLANEOUS) ×1 IMPLANT
DECANTER SPIKE VIAL GLASS SM (MISCELLANEOUS) ×6 IMPLANT
DEFOGGER SCOPE WARMER CLEARIFY (MISCELLANEOUS) ×3 IMPLANT
DERMABOND ADVANCED (GAUZE/BANDAGES/DRESSINGS) ×2
DERMABOND ADVANCED .7 DNX12 (GAUZE/BANDAGES/DRESSINGS) ×1 IMPLANT
DISSECTOR BLUNT TIP ENDO 5MM (MISCELLANEOUS) IMPLANT
DISSECTOR KITTNER STICK (MISCELLANEOUS) ×1 IMPLANT
DISSECTORS/KITTNER STICK (MISCELLANEOUS) ×3
DRAIN CHANNEL JP 15F RND 16 (MISCELLANEOUS) ×3 IMPLANT
DRAPE C-ARM XRAY 36X54 (DRAPES) IMPLANT
DRAPE SHEET LG 3/4 BI-LAMINATE (DRAPES) IMPLANT
ELECT CAUTERY BLADE 6.4 (BLADE) ×3 IMPLANT
ELECT REM PT RETURN 9FT ADLT (ELECTROSURGICAL) ×3
ELECTRODE REM PT RTRN 9FT ADLT (ELECTROSURGICAL) ×1 IMPLANT
GLOVE BIOGEL PI IND STRL 7.0 (GLOVE) ×1 IMPLANT
GLOVE BIOGEL PI INDICATOR 7.0 (GLOVE) ×2
GLOVE SURG SYN 7.0 (GLOVE) ×3 IMPLANT
GOWN STRL REUS W/ TWL LRG LVL3 (GOWN DISPOSABLE) ×3 IMPLANT
GOWN STRL REUS W/TWL LRG LVL3 (GOWN DISPOSABLE) ×6
GRASPER SUT TROCAR 14GX15 (MISCELLANEOUS) ×3 IMPLANT
HEMOSTAT SURGICEL 2X3 (HEMOSTASIS) ×3 IMPLANT
IRRIGATION STRYKERFLOW (MISCELLANEOUS) ×1 IMPLANT
IRRIGATOR STRYKERFLOW (MISCELLANEOUS) ×3
IV CATH ANGIO 12GX3 LT BLUE (NEEDLE) IMPLANT
IV NS 1000ML (IV SOLUTION) ×2
IV NS 1000ML BAXH (IV SOLUTION) ×1 IMPLANT
JACKSON PRATT 10 (INSTRUMENTS) IMPLANT
L-HOOK LAP DISP 36CM (ELECTROSURGICAL) ×3
LHOOK LAP DISP 36CM (ELECTROSURGICAL) ×1 IMPLANT
NEEDLE HYPO 22GX1.5 SAFETY (NEEDLE) ×3 IMPLANT
PACK LAP CHOLECYSTECTOMY (MISCELLANEOUS) ×3 IMPLANT
PENCIL ELECTRO HAND CTR (MISCELLANEOUS) ×3 IMPLANT
PORT ACCESS TROCAR AIRSEAL 5 (TROCAR) IMPLANT
POUCH SPECIMEN RETRIEVAL 10MM (ENDOMECHANICALS) ×3 IMPLANT
SCISSORS METZENBAUM CVD 33 (INSTRUMENTS) ×3 IMPLANT
SET TRI-LUMEN FLTR TB AIRSEAL (TUBING) IMPLANT
SLEEVE ENDOPATH XCEL 5M (ENDOMECHANICALS) ×6 IMPLANT
SPONGE DRAIN TRACH 4X4 STRL 2S (GAUZE/BANDAGES/DRESSINGS) ×3 IMPLANT
SPONGE LAP 18X18 RF (DISPOSABLE) ×3 IMPLANT
STOPCOCK 4 WAY LG BORE MALE ST (IV SETS) IMPLANT
SUT ETHILON 3-0 FS-10 30 BLK (SUTURE) ×3
SUT MNCRL 4-0 (SUTURE) ×2
SUT MNCRL 4-0 27XMFL (SUTURE) ×1
SUT VIC AB 3-0 SH 27 (SUTURE)
SUT VIC AB 3-0 SH 27X BRD (SUTURE) IMPLANT
SUT VICRYL 0 AB UR-6 (SUTURE) ×6 IMPLANT
SUTURE EHLN 3-0 FS-10 30 BLK (SUTURE) ×1 IMPLANT
SUTURE MNCRL 4-0 27XMF (SUTURE) ×1 IMPLANT
SYR 20CC LL (SYRINGE) ×3 IMPLANT
TOWEL OR 17X26 4PK STRL BLUE (TOWEL DISPOSABLE) ×3 IMPLANT
TROCAR XCEL BLUNT TIP 100MML (ENDOMECHANICALS) ×3 IMPLANT
TROCAR XCEL NON-BLD 5MMX100MML (ENDOMECHANICALS) ×3 IMPLANT
WATER STERILE IRR 1000ML POUR (IV SOLUTION) ×3 IMPLANT

## 2018-05-25 NOTE — Anesthesia Preprocedure Evaluation (Signed)
Anesthesia Evaluation  Patient identified by MRN, date of birth, ID band Patient awake    Reviewed: Allergy & Precautions, NPO status , Patient's Chart, lab work & pertinent test results  History of Anesthesia Complications Negative for: history of anesthetic complications  Airway Mallampati: II       Dental   Pulmonary neg sleep apnea, neg COPD, former smoker,           Cardiovascular (-) hypertension(-) Past MI and (-) CHF (-) dysrhythmias (-) Valvular Problems/Murmurs     Neuro/Psych neg Seizures Anxiety Depression    GI/Hepatic Neg liver ROS, GERD  Medicated and Controlled,  Endo/Other  diabetes, Type 2, Oral Hypoglycemic Agents  Renal/GU Renal disease (stones)     Musculoskeletal   Abdominal   Peds  Hematology   Anesthesia Other Findings   Reproductive/Obstetrics                             Anesthesia Physical Anesthesia Plan  ASA: III and emergent  Anesthesia Plan: General   Post-op Pain Management:    Induction: Intravenous  PONV Risk Score and Plan: 2 and Dexamethasone and Ondansetron  Airway Management Planned: Oral ETT  Additional Equipment:   Intra-op Plan:   Post-operative Plan:   Informed Consent: I have reviewed the patients History and Physical, chart, labs and discussed the procedure including the risks, benefits and alternatives for the proposed anesthesia with the patient or authorized representative who has indicated his/her understanding and acceptance.     Plan Discussed with:   Anesthesia Plan Comments:         Anesthesia Quick Evaluation

## 2018-05-25 NOTE — Interval H&P Note (Signed)
History and Physical Interval Note:  05/25/2018 9:43 PM  Joseph Clements  has presented today for surgery, with the diagnosis of acute cholecystitis  The various methods of treatment have been discussed with the patient and family. After consideration of risks, benefits and other options for treatment, the patient has consented to  Procedure(s): LAPAROSCOPIC CHOLECYSTECTOMY (N/A) as a surgical intervention .  The patient's history has been reviewed, patient examined, no change in status, stable for surgery.  I have reviewed the patient's chart and labs.  Questions were answered to the patient's satisfaction.     Manal Kreutzer Lysle Pearl

## 2018-05-25 NOTE — Progress Notes (Signed)
IS education complete. Pt understands technique and reason for use. Pt inspire 176ml+. Pt independent with use.

## 2018-05-25 NOTE — Progress Notes (Signed)
Dr. Lysle Pearl was notified of blood pressure.

## 2018-05-25 NOTE — Anesthesia Procedure Notes (Signed)
Procedure Name: Intubation Date/Time: 05/25/2018 10:11 PM Performed by: Milus Height, CRNA Pre-anesthesia Checklist: Patient identified, Patient being monitored, Timeout performed, Emergency Drugs available and Suction available Patient Re-evaluated:Patient Re-evaluated prior to induction Oxygen Delivery Method: Circle system utilized Preoxygenation: Pre-oxygenation with 100% oxygen Induction Type: IV induction Ventilation: Mask ventilation without difficulty Laryngoscope Size: Mac and 3 Grade View: Grade I Tube type: Oral Tube size: 7.5 mm Number of attempts: 1 Airway Equipment and Method: Stylet Placement Confirmation: ETT inserted through vocal cords under direct vision,  positive ETCO2 and breath sounds checked- equal and bilateral Secured at: 21 cm Tube secured with: Tape Dental Injury: Teeth and Oropharynx as per pre-operative assessment

## 2018-05-26 ENCOUNTER — Encounter: Payer: Self-pay | Admitting: Surgery

## 2018-05-26 ENCOUNTER — Inpatient Hospital Stay: Payer: Medicare Other

## 2018-05-26 LAB — BASIC METABOLIC PANEL
Anion gap: 11 (ref 5–15)
BUN: 18 mg/dL (ref 8–23)
CALCIUM: 8.8 mg/dL — AB (ref 8.9–10.3)
CHLORIDE: 95 mmol/L — AB (ref 98–111)
CO2: 24 mmol/L (ref 22–32)
Creatinine, Ser: 1.21 mg/dL (ref 0.61–1.24)
GFR calc Af Amer: >60 mL/min (ref >60)
GFR calc non Af Amer: 56 mL/min — ABNORMAL LOW (ref >60)
GLUCOSE: 221 mg/dL — AB (ref 70–99)
Potassium: 3.7 mmol/L (ref 3.5–5.1)
Sodium: 130 mmol/L — ABNORMAL LOW (ref 135–145)

## 2018-05-26 LAB — CBC
HEMATOCRIT: 42.1 % (ref 40.0–52.0)
HEMOGLOBIN: 14.6 g/dL (ref 13.0–18.0)
MCH: 30.2 pg (ref 26.0–34.0)
MCHC: 34.6 g/dL (ref 32.0–36.0)
MCV: 87.2 fL (ref 80.0–100.0)
Platelets: 230 10*3/uL (ref 150–440)
RBC: 4.82 MIL/uL (ref 4.40–5.90)
RDW: 13.7 % (ref 11.5–14.5)
WBC: 16 10*3/uL — ABNORMAL HIGH (ref 3.8–10.6)

## 2018-05-26 LAB — HEPATIC FUNCTION PANEL
ALBUMIN: 2.9 g/dL — AB (ref 3.5–5.0)
ALK PHOS: 143 U/L — AB (ref 38–126)
ALT: 255 U/L — AB (ref 0–44)
AST: 183 U/L — ABNORMAL HIGH (ref 15–41)
BILIRUBIN DIRECT: 3.5 mg/dL — AB (ref 0.0–0.2)
BILIRUBIN TOTAL: 4.9 mg/dL — AB (ref 0.3–1.2)
Indirect Bilirubin: 1.4 mg/dL — ABNORMAL HIGH (ref 0.3–0.9)
Total Protein: 6.5 g/dL (ref 6.5–8.1)

## 2018-05-26 LAB — GLUCOSE, CAPILLARY
Glucose-Capillary: 102 mg/dL — ABNORMAL HIGH (ref 70–99)
Glucose-Capillary: 177 mg/dL — ABNORMAL HIGH (ref 70–99)
Glucose-Capillary: 203 mg/dL — ABNORMAL HIGH (ref 70–99)
Glucose-Capillary: 213 mg/dL — ABNORMAL HIGH (ref 70–99)
Glucose-Capillary: 97 mg/dL (ref 70–99)

## 2018-05-26 LAB — MAGNESIUM: Magnesium: 1.6 mg/dL — ABNORMAL LOW (ref 1.7–2.4)

## 2018-05-26 LAB — PHOSPHORUS: Phosphorus: 2.6 mg/dL (ref 2.5–4.6)

## 2018-05-26 MED ORDER — ONDANSETRON HCL 4 MG/2ML IJ SOLN: 4.0000 mg | Freq: Once | INTRAMUSCULAR | Status: DC | PRN

## 2018-05-26 MED ORDER — BUPROPION HCL ER (XL) 150 MG PO TB24
300.0000 mg | ORAL_TABLET | Freq: Every day | ORAL | Status: DC
  Administered 2018-05-26 – 2018-05-28 (×3): 300 mg via ORAL
  Filled 2018-05-26 (×3): qty 2

## 2018-05-26 MED ORDER — ESCITALOPRAM OXALATE 10 MG PO TABS
10.0000 mg | ORAL_TABLET | Freq: Every day | ORAL | Status: DC
  Administered 2018-05-26 – 2018-05-28 (×3): 10 mg via ORAL
  Filled 2018-05-26 (×3): qty 1

## 2018-05-26 MED ORDER — KCL IN DEXTROSE-NACL 40-5-0.45 MEQ/L-%-% IV SOLN
INTRAVENOUS | Status: DC
  Administered 2018-05-26 – 2018-05-27 (×2): via INTRAVENOUS
  Filled 2018-05-26 (×4): qty 1000

## 2018-05-26 MED ORDER — GADOBENATE DIMEGLUMINE 529 MG/ML IV SOLN
15.0000 mL | Freq: Once | INTRAVENOUS | Status: AC | PRN
  Administered 2018-05-26: 15 mL via INTRAVENOUS

## 2018-05-26 MED ORDER — TAMSULOSIN HCL 0.4 MG PO CAPS
0.4000 mg | ORAL_CAPSULE | Freq: Every day | ORAL | Status: DC
  Administered 2018-05-27: 0.4 mg via ORAL
  Filled 2018-05-26 (×3): qty 1

## 2018-05-26 MED ORDER — FENTANYL CITRATE (PF) 100 MCG/2ML IJ SOLN: 25.0000 ug | INTRAMUSCULAR | Status: DC | PRN

## 2018-05-26 NOTE — Anesthesia Postprocedure Evaluation (Signed)
Anesthesia Post Note  Patient: Joseph Clements  Procedure(s) Performed: LAPAROSCOPIC CHOLECYSTECTOMY (N/A )  Patient location during evaluation: PACU Anesthesia Type: General Level of consciousness: awake and alert Pain management: pain level controlled Vital Signs Assessment: post-procedure vital signs reviewed and stable Respiratory status: spontaneous breathing and respiratory function stable Cardiovascular status: stable Anesthetic complications: no     Last Vitals:  Vitals:   05/25/18 1353 05/26/18 0025  BP: (!) 180/88   Pulse:    Resp:    Temp:  (!) (P) 36.2 C  SpO2:      Last Pain:  Vitals:   05/25/18 2028  TempSrc:   PainSc: 6                  KEPHART,WILLIAM K

## 2018-05-26 NOTE — Anesthesia Post-op Follow-up Note (Signed)
Anesthesia QCDR form completed.        

## 2018-05-26 NOTE — Transfer of Care (Signed)
Immediate Anesthesia Transfer of Care Note  Patient: Joseph Clements  Procedure(s) Performed: LAPAROSCOPIC CHOLECYSTECTOMY (N/A )  Patient Location: PACU  Anesthesia Type:General  Level of Consciousness: drowsy and patient cooperative  Airway & Oxygen Therapy: Patient Spontanous Breathing and Patient connected to face mask oxygen  Post-op Assessment: Report given to RN, Post -op Vital signs reviewed and stable and Patient moving all extremities  Post vital signs: Reviewed and stable  Last Vitals:  Vitals Value Taken Time  BP    Temp    Pulse    Resp    SpO2      Last Pain:  Vitals:   05/25/18 2028  TempSrc:   PainSc: 6          Complications: No apparent anesthesia complications

## 2018-05-26 NOTE — Op Note (Signed)
Preoperative diagnosis:  acute and cholecystitis  Postoperative diagnosis: same as above  Procedure: Laparoscopic Cholecystectomy.   Anesthesia: GETA   Surgeon: Benjamine Sprague  Specimen: Gallbladder  Complications: None  EBL: 58mL  Wound Classification: Clean Contaminated  Indications: see HPI  Findings: Critical view of safety noted Cystic duct and artery identified, ligated and divided, clips remained intact at end of procedure Adequate hemostasis  Description of procedure: The patient was placed on the operating table in the supine position. SCDs placed, pre-op abx administered.  General anesthesia was induced and OG tube placed by anesthesia. A time-out was completed verifying correct patient, procedure, site, positioning, and implant(s) and/or special equipment prior to beginning this procedure. The abdomen was prepped and draped in the usual sterile fashion.  An incision was made in a natural skin line under the umbilicus.  Dissection carried down to fascia where two 0 vicryl sutures placed to use as anchor sutures for hasson port.  Incision made into fascia and blunt dissection used to enter peritoneum.  Hasson port placed and insufflation started up to 66mm Hg without any dramatic increase in pressure.    The laparoscope was inserted and the abdomen inspected. No injuries from initial trocar placement were noted. Additional trocars were then inserted under direct visualization in the following locations: a 5-mm trocar in the subxyphoid region and two 5-mm trocars along the right costal margin. The abdomen was inspected and no abnormalities or injuries were found. The table was placed in the reverse Trendelenburg position with the right side up.  Thick omental adhesions were noted on the gallbladder.  After gentle traction, a gangernous gallbladder was noted.  This was drained of bile with a needle prior to dissection.   The dome of the gallbladder was grasped with an atraumatic  grasper passed through the lateral port and retracted over the dome of the liver. The infundibulum was also grasped with an atraumatic grasper and retracted toward the right lower quadrant. Extensive dissection of surrounding omentum and peritoneal attachments performed.   This maneuver eventually exposed Calot's triangle. The peritoneum overlying the gallbladder infundibulum was then dissected and the cystic duct and cystic artery identified.  Critical view of safety with the liver bed clearly visible behind the duct and artery with no additional structures noted.  Picture taken before the cystic duct and cystic artery clipped and divided close to the gallbladder.  The gallbladder was then attempted to be dissected from its peritoneal and liver bed attachments by electrocautery. Due to the gangernous wall and extensive adhesions, the back wall of the gallbladder could not be removed completely from the liver bed.   Hemostasis was checked and the majority of the gallbladder was removed using an endoscopic retrieval bag placed through the umbilical port. The gallbladder was passed off the table as a specimen. The gallbladder fossa was copiously irrigated with saline and any leaked bile was suctioned out, and hemostasis was obtained. There was no evidence of bleeding from the gallbladder fossa or cystic artery or leakage of the bile from the cystic duct stump.   Surgciell placed into fossa prior to placing a blake drain at the fossa.  Abdomen desufflated and secondary trocars were removed under direct vision. No bleeding was noted. The laparoscope was withdrawn and the umbilical trocar removed.  The fascia of the Hasson trocar site was closed with figure-of-eight 0 vicryl sutures.  3-0 vicryl used to close deep dermal layer at umbilical site.  All skin incisions then closed with subcuticular sutures  of 4-0 monocryl and dressed with topical skin adhesive. The orogastric tube was removed and patient extubated. The  patient tolerated the procedure well and was taken to the postanesthesia care unit in stable condition.  All sponge and instrument count correct at end of procedure.

## 2018-05-26 NOTE — Progress Notes (Signed)
Subjective:  CC:  Joseph Clements is a 77 y.o. male  Hospital stay day 2, 1 Day Post-Op lap chole for gangrenous cholecystitis  HPI: Doing well.  No issues overnight.  ROS:  A 5 point review of systems was performed and pertinent positives and negatives noted in HPI.   Objective:      Temp:  [86.9 F (30.5 C)-97.6 F (36.4 C)] 97.6 F (36.4 C) (09/29 0342) Pulse Rate:  [104-132] 106 (09/29 0342) Resp:  [9-20] 20 (09/29 0235) BP: (117-180)/(73-88) 117/74 (09/29 0342) SpO2:  [93 %-100 %] 96 % (09/29 0342)     Height: 5\' 6"  (167.6 cm) Weight: 74.8 kg BMI (Calculated): 26.64   Intake/Output this shift:  Total I/O In: -  Out: 650 [Urine:650]  JP 35ML     Constitutional :  alert, cooperative, appears stated age and no distress  Respiratory:  clear to auscultation bilaterally  Cardiovascular:  regular rate and rhythm  Gastrointestinal: soft, tender as expected around incision and RUQ, JP with serosanguinous drainage..   Skin: Cool and moist.   Psychiatric: Normal affect, non-agitated, not confused       LABS:  CMP Latest Ref Rng & Units 05/26/2018 05/25/2018 05/24/2018  Glucose 70 - 99 mg/dL 221(H) 235(H) 182(H)  BUN 8 - 23 mg/dL 18 12 16   Creatinine 0.61 - 1.24 mg/dL 1.21 0.93 1.09  Sodium 135 - 145 mmol/L 130(L) 131(L) 136  Potassium 3.5 - 5.1 mmol/L 3.7 3.7 4.3  Chloride 98 - 111 mmol/L 95(L) 95(L) 100  CO2 22 - 32 mmol/L 24 25 27   Calcium 8.9 - 10.3 mg/dL 8.8(L) 9.2 9.4  Total Protein 6.5 - 8.1 g/dL 6.5 7.7 7.5  Total Bilirubin 0.3 - 1.2 mg/dL 4.9(H) 1.1 0.9  Alkaline Phos 38 - 126 U/L 143(H) 129(H) 129(H)  AST 15 - 41 U/L 183(H) 25 20  ALT 0 - 44 U/L 255(H) 56(H) 50(H)   CBC Latest Ref Rng & Units 05/26/2018 05/25/2018 05/24/2018  WBC 3.8 - 10.6 K/uL 16.0(H) 14.1(H) 10.3  Hemoglobin 13.0 - 18.0 g/dL 14.6 15.2 14.6  Hematocrit 40.0 - 52.0 % 42.1 44.5 42.4  Platelets 150 - 440 K/uL 230 255 293    RADS:pending MRCP Assessment:   ap chole for gangrenous  cholecystitis.  Increased LFTs.  Asymptomatic but will obtain MRCP to assess for retained stones.

## 2018-05-27 LAB — CBC
HEMATOCRIT: 38 % — AB (ref 40.0–52.0)
HEMOGLOBIN: 13.1 g/dL (ref 13.0–18.0)
MCH: 30.7 pg (ref 26.0–34.0)
MCHC: 34.5 g/dL (ref 32.0–36.0)
MCV: 88.9 fL (ref 80.0–100.0)
Platelets: 244 10*3/uL (ref 150–440)
RBC: 4.28 MIL/uL — ABNORMAL LOW (ref 4.40–5.90)
RDW: 14 % (ref 11.5–14.5)
WBC: 9.7 10*3/uL (ref 3.8–10.6)

## 2018-05-27 LAB — BASIC METABOLIC PANEL
ANION GAP: 9 (ref 5–15)
BUN: 31 mg/dL — ABNORMAL HIGH (ref 8–23)
CALCIUM: 8.7 mg/dL — AB (ref 8.9–10.3)
CO2: 23 mmol/L (ref 22–32)
Chloride: 104 mmol/L (ref 98–111)
Creatinine, Ser: 1.74 mg/dL — ABNORMAL HIGH (ref 0.61–1.24)
GFR calc Af Amer: 42 mL/min — ABNORMAL LOW (ref >60)
GFR calc non Af Amer: 36 mL/min — ABNORMAL LOW (ref >60)
GLUCOSE: 116 mg/dL — AB (ref 70–99)
Potassium: 3.6 mmol/L (ref 3.5–5.1)
Sodium: 136 mmol/L (ref 135–145)

## 2018-05-27 LAB — GLUCOSE, CAPILLARY
GLUCOSE-CAPILLARY: 148 mg/dL — AB (ref 70–99)
GLUCOSE-CAPILLARY: 89 mg/dL (ref 70–99)
Glucose-Capillary: 243 mg/dL — ABNORMAL HIGH (ref 70–99)
Glucose-Capillary: 119 mg/dL — ABNORMAL HIGH (ref 70–99)
Glucose-Capillary: 166 mg/dL — ABNORMAL HIGH (ref 70–99)

## 2018-05-27 LAB — PHOSPHORUS: PHOSPHORUS: 3 mg/dL (ref 2.5–4.6)

## 2018-05-27 LAB — HEPATIC FUNCTION PANEL
ALBUMIN: 2.7 g/dL — AB (ref 3.5–5.0)
ALK PHOS: 101 U/L (ref 38–126)
ALT: 150 U/L — AB (ref 0–44)
AST: 48 U/L — AB (ref 15–41)
BILIRUBIN DIRECT: 0.3 mg/dL — AB (ref 0.0–0.2)
BILIRUBIN TOTAL: 1.1 mg/dL (ref 0.3–1.2)
Indirect Bilirubin: 0.8 mg/dL (ref 0.3–0.9)
Total Protein: 5.9 g/dL — ABNORMAL LOW (ref 6.5–8.1)

## 2018-05-27 LAB — MAGNESIUM: Magnesium: 2.1 mg/dL (ref 1.7–2.4)

## 2018-05-27 MED ORDER — SODIUM CHLORIDE 0.9 % IV SOLN
INTRAVENOUS | Status: DC
  Administered 2018-05-27 – 2018-05-28 (×4): via INTRAVENOUS

## 2018-05-27 MED ORDER — FAMOTIDINE 20 MG PO TABS
20.0000 mg | ORAL_TABLET | Freq: Two times a day (BID) | ORAL | Status: DC
  Administered 2018-05-27 – 2018-05-28 (×2): 20 mg via ORAL
  Filled 2018-05-27 (×2): qty 1

## 2018-05-27 NOTE — Progress Notes (Signed)
PHARMACIST - PHYSICIAN COMMUNICATION  DR:  Lysle Pearl  CONCERNING: IV to Oral Route Change Policy  RECOMMENDATION: This patient is receiving famotidine by the intravenous route.  Based on criteria approved by the Pharmacy and Therapeutics Committee, the intravenous medication(s) is/are being converted to the equivalent oral dose form(s).   DESCRIPTION: These criteria include:  The patient is eating (either orally or via tube) and/or has been taking other orally administered medications for a least 24 hours  The patient has no evidence of active gastrointestinal bleeding or impaired GI absorption (gastrectomy, short bowel, patient on TNA or NPO).  If you have questions about this conversion, please contact the Pharmacy Department  []   [phone] )  [facility] [x]   [phone] )  [facility] []   [phone] )  [facility] []   [phone] )  [facility] []   [phone] )  [place], [place] , PharmD 05/27/2018 3:02 PM

## 2018-05-27 NOTE — Care Management Important Message (Signed)
Copy of signed IM left with patient in room.  

## 2018-05-27 NOTE — Progress Notes (Signed)
Subjective:  CC:  Joseph Clements is a 77 y.o. male  Hospital stay day 3, 2 Days Post-Op lap chole for gangrenous cholecystitis  HPI: Doing well.  No issues overnight.  ROS:  A 5 point review of systems was performed and pertinent positives and negatives noted in HPI.   Objective:      Temp:  [97.9 F (36.6 C)-98.2 F (36.8 C)] 97.9 F (36.6 C) (09/30 0507) Pulse Rate:  [89-104] 89 (09/30 0507) Resp:  [20-22] 20 (09/30 0507) BP: (118-126)/(64-75) 118/64 (09/30 0507) SpO2:  [95 %-98 %] 95 % (09/30 0507)     Height: 5\' 6"  (167.6 cm) Weight: 74.8 kg BMI (Calculated): 26.64   Intake/Output this shift:  Total I/O In: 788.5 [I.V.:68.1; IV Piggyback:720.5] Out: 230 [Urine:200; Drains:30]  JP 20ML serosanguinous     Constitutional :  alert, cooperative, appears stated age and no distress  Respiratory:  clear to auscultation bilaterally  Cardiovascular:  regular rate and rhythm  Gastrointestinal: soft, tender as expected around incision and RUQ, JP with serosanguinous drainage..   Skin: Cool and moist. Incisions c/d/i.  Psychiatric: Normal affect, non-agitated, not confused       LABS:  CMP Latest Ref Rng & Units 05/27/2018 05/26/2018 05/25/2018  Glucose 70 - 99 mg/dL 116(H) 221(H) 235(H)  BUN 8 - 23 mg/dL 31(H) 18 12  Creatinine 0.61 - 1.24 mg/dL 1.74(H) 1.21 0.93  Sodium 135 - 145 mmol/L 136 130(L) 131(L)  Potassium 3.5 - 5.1 mmol/L 3.6 3.7 3.7  Chloride 98 - 111 mmol/L 104 95(L) 95(L)  CO2 22 - 32 mmol/L 23 24 25   Calcium 8.9 - 10.3 mg/dL 8.7(L) 8.8(L) 9.2  Total Protein 6.5 - 8.1 g/dL 5.9(L) 6.5 7.7  Total Bilirubin 0.3 - 1.2 mg/dL 1.1 4.9(H) 1.1  Alkaline Phos 38 - 126 U/L 101 143(H) 129(H)  AST 15 - 41 U/L 48(H) 183(H) 25  ALT 0 - 44 U/L 150(H) 255(H) 56(H)   CBC Latest Ref Rng & Units 05/27/2018 05/26/2018 05/25/2018  WBC 3.8 - 10.6 K/uL 9.7 16.0(H) 14.1(H)  Hemoglobin 13.0 - 18.0 g/dL 13.1 14.6 15.2  Hematocrit 40.0 - 52.0 % 38.0(L) 42.1 44.5  Platelets 150 - 440  K/uL 244 230 255    RADS: CLINICAL DATA:  Laparoscopic cholecystectomy.  Ultrasound 05/24/2016 with concerns for cholecystitis.  EXAM: MRI ABDOMEN WITHOUT AND WITH CONTRAST (INCLUDING MRCP)  TECHNIQUE: Multiplanar multisequence MR imaging of the abdomen was performed both before and after the administration of intravenous contrast. Heavily T2-weighted images of the biliary and pancreatic ducts were obtained, and three-dimensional MRCP images were rendered by post processing.  CONTRAST:  61mL MULTIHANCE GADOBENATE DIMEGLUMINE 529 MG/ML IV SOLN  COMPARISON:  Ultrasound 05/24/2018, CT 10/21/2015  FINDINGS: Lower chest: Lung bases are clear  Hepatobiliary: Interval cholecystectomy. Small amount fluid the gallbladder fossa. No intrahepatic duct dilatation. The common hepatic duct and common bile duct are normal caliber. No choledocholithiasis.  Pancreas: Pancreas is normal. No ductal dilatation. No pancreatic inflammation.  Spleen: Normal spleen  Adrenals/urinary tract: Adrenal glands and kidneys are normal. The ureters and bladder normal.  Stomach/Bowel: Stomach normal. Duodenum normal. There is mildly distended multiple loops of small bowel consistent postoperative ileus.  Vascular/Lymphatic: Abdominal aorta is normal caliber. No periportal or retroperitoneal adenopathy. No pelvic adenopathy.  Other: No free fluid.  Musculoskeletal: No aggressive osseous lesion.  IMPRESSION: 1. Post cholecystectomy with small amount of expected fluid in the gallbladder fossa. 2. No intrahepatic or extrahepatic biliary duct dilatation. Common bile duct is  normal caliber. No choledocholithiasis. 3. Normal pancreas. 4. Dilated loops of small bowel consistent with postoperative ileus.   Electronically Signed   By: Suzy Bouchard M.D.   On: 05/26/2018 15:09  Assessment:   lap chole for gangrenous cholecystitis. Doing well. ADAT  Elevated liver enzymes-  normalized. Likely reactive from surgery.  AKI- elevated creatinine. Likely from contrast administration, restarted IVF.  Will recheck in am.  Consider nephro referral if no improvement after today.

## 2018-05-28 LAB — BASIC METABOLIC PANEL
ANION GAP: 7 (ref 5–15)
BUN: 22 mg/dL (ref 8–23)
CHLORIDE: 110 mmol/L (ref 98–111)
CO2: 23 mmol/L (ref 22–32)
Calcium: 8.5 mg/dL — ABNORMAL LOW (ref 8.9–10.3)
Creatinine, Ser: 1.3 mg/dL — ABNORMAL HIGH (ref 0.61–1.24)
GFR calc Af Amer: 59 mL/min — ABNORMAL LOW (ref >60)
GFR, EST NON AFRICAN AMERICAN: 51 mL/min — AB (ref >60)
GLUCOSE: 161 mg/dL — AB (ref 70–99)
Potassium: 3.7 mmol/L (ref 3.5–5.1)
Sodium: 140 mmol/L (ref 135–145)

## 2018-05-28 LAB — HEPATIC FUNCTION PANEL
ALT: 102 U/L — AB (ref 0–44)
AST: 25 U/L (ref 15–41)
Albumin: 2.7 g/dL — ABNORMAL LOW (ref 3.5–5.0)
Alkaline Phosphatase: 93 U/L (ref 38–126)
Bilirubin, Direct: 0.2 mg/dL (ref 0.0–0.2)
Indirect Bilirubin: 0.4 mg/dL (ref 0.3–0.9)
TOTAL PROTEIN: 5.1 g/dL — AB (ref 6.5–8.1)
Total Bilirubin: 0.6 mg/dL (ref 0.3–1.2)

## 2018-05-28 LAB — CBC
HCT: 36.6 % — ABNORMAL LOW (ref 40.0–52.0)
Hemoglobin: 12.3 g/dL — ABNORMAL LOW (ref 13.0–18.0)
MCH: 30 pg (ref 26.0–34.0)
MCHC: 33.7 g/dL (ref 32.0–36.0)
MCV: 89.2 fL (ref 80.0–100.0)
PLATELETS: 260 10*3/uL (ref 150–440)
RBC: 4.1 MIL/uL — AB (ref 4.40–5.90)
RDW: 14 % (ref 11.5–14.5)
WBC: 6.5 10*3/uL (ref 3.8–10.6)

## 2018-05-28 LAB — GLUCOSE, CAPILLARY
Glucose-Capillary: 123 mg/dL — ABNORMAL HIGH (ref 70–99)
Glucose-Capillary: 106 mg/dL — ABNORMAL HIGH (ref 70–99)

## 2018-05-28 LAB — MAGNESIUM: MAGNESIUM: 1.9 mg/dL (ref 1.7–2.4)

## 2018-05-28 LAB — SURGICAL PATHOLOGY

## 2018-05-28 LAB — PHOSPHORUS: Phosphorus: 2.7 mg/dL (ref 2.5–4.6)

## 2018-05-28 MED ORDER — DOCUSATE SODIUM 100 MG PO CAPS: 100.0000 mg | ORAL_CAPSULE | Freq: Two times a day (BID) | ORAL | 0 refills | Status: AC | PRN

## 2018-05-28 MED ORDER — HYDROCODONE-ACETAMINOPHEN 5-325 MG PO TABS: 1.0000 | ORAL_TABLET | Freq: Four times a day (QID) | ORAL | 0 refills | Status: AC | PRN

## 2018-05-28 MED ORDER — ACETAMINOPHEN 325 MG PO TABS: 650.0000 mg | ORAL_TABLET | Freq: Three times a day (TID) | ORAL | 0 refills | Status: AC | PRN

## 2018-05-28 MED ORDER — HYDROCODONE-ACETAMINOPHEN 5-325 MG PO TABS: 1.0000 | ORAL_TABLET | Freq: Four times a day (QID) | ORAL | 0 refills | Status: DC | PRN

## 2018-05-28 NOTE — Discharge Instructions (Signed)
-   tylenol  as needed for discomfort.  - Use narcotics, if prescribed, only when tylenol is not enough to control pain. - 325-650mg  every 8hrs to max of 4000mg /24hrs (including the 325mg  in every norco dose) for the tylenol.   - Continue to drink plenty of water. - You may shower - You may drive

## 2018-05-28 NOTE — Discharge Summary (Signed)
Physician Discharge Summary  Patient ID: Joseph Clements MRN: [id number] DOB/AGE: Sep 15, 1940 77 y.o.  Admit date: 05/24/2018 Discharge date: 05/28/2018  Admission Diagnoses: acute cholecystitis  Discharge Diagnoses:  Same as above, plus AKI secondary to IV contrast, elevated LFTs   Discharged Condition: good  Hospital Course: Patient admitted to the hospital for acute cholecystitis underwent somewhat complicated lap chole.  Please see the op note for further details.  Postop patient noted to have incidental highly elevated LFTs therefore an MRCP was performed to exclude a retained stone.  MRCP is negative and subsequent lab follow-up showed that LFTs normalized.  After the MRCP was noted that his creatinine increased as well likely due to the high contrast load.  He was resuscitated with IV fluids and creatinine levels came down.  At time of discharge pain was controlled tolerating regular diet no evidence of bile leak and voiding appropriately.  He will follow-up in 1 week with drain in place.   Consults: None  Discharge Exam: Blood pressure 122/73, pulse 89, temperature (!) 97.5 F (36.4 C), temperature source Oral, resp. rate 18, height 5\' 6"  (1.676 m), weight 74.8 kg, SpO2 99 %. General appearance: alert, cooperative and no distress GI: soft, non-tender; bowel sounds normal; no masses,  no organomegaly and incisions c/d/i, JP with serous drainage  Disposition:  Discharge disposition: 01-Home or Self Care       Discharge Instructions    Discharge patient   Complete by:  As directed    Discharge disposition:  01-Home or Self Care   Discharge patient date:  05/28/2018   JP/Blake drain to bulb suction   Complete by:  As directed    Please provide JP drain care instructions prior to d/c.  Keep record of daily output     Allergies as of 05/28/2018      Reactions   Shellfish Allergy Nausea And Vomiting   NO PROBLEM WITH BETADINE      Medication List    TAKE these  medications   acetaminophen 325 MG tablet Commonly known as:  TYLENOL Take 2 tablets (650 mg total) by mouth every 8 (eight) hours as needed for mild pain.   buPROPion 300 MG 24 hr tablet Commonly known as:  WELLBUTRIN XL Take 300 mg by mouth daily.   docusate sodium 100 MG capsule Commonly known as:  COLACE Take 1 capsule (100 mg total) by mouth 2 (two) times daily. What changed:  Another medication with the same name was added. Make sure you understand how and when to take each.   docusate sodium 100 MG capsule Commonly known as:  COLACE Take 1 capsule (100 mg total) by mouth 2 (two) times daily as needed for up to 10 days for mild constipation. What changed:  You were already taking a medication with the same name, and this prescription was added. Make sure you understand how and when to take each.   escitalopram 5 MG tablet Commonly known as:  LEXAPRO Take 10 mg by mouth daily.   HYDROcodone-acetaminophen 5-325 MG tablet Commonly known as:  NORCO/VICODIN Take 1 tablet by mouth every 6 (six) hours as needed for up to 3 days for moderate pain or severe pain. What changed:    how much to take  reasons to take this   Magnesium 250 MG Tabs Take 250 mg by mouth at bedtime.   metFORMIN 500 MG 24 hr tablet Commonly known as:  GLUCOPHAGE-XR Take 1,000 mg by mouth 2 (two) times daily.  ondansetron 4 MG disintegrating tablet Commonly known as:  ZOFRAN-ODT Take 1 tablet (4 mg total) by mouth every 4 (four) hours as needed for nausea or vomiting.   pantoprazole 40 MG tablet Commonly known as:  PROTONIX Take 40 mg by mouth daily.   pioglitazone 30 MG tablet Commonly known as:  ACTOS Take 30 mg by mouth daily.   tamsulosin 0.4 MG Caps capsule Commonly known as:  FLOMAX Take 1 capsule (0.4 mg total) by mouth daily.   VITAMIN B-12 PO Take 2,500 mcg by mouth daily.      Follow-up Information    Delanna Blacketer, DO Follow up in 1 week(s).   Specialty:  Surgery Why:  for  wound check and possible removal of JP drain Contact information: [address] [place] [address] [phone]            Total time spent arranging discharge was >55min. Signed: Benjamine Sprague 05/28/2018, 9:01 AM

## 2019-10-27 IMAGING — MR MR 3D RECON AT SCANNER
22 series · 22 of 22 positions shown · IV contrast (MULTIHANCE)
Comparison: Ultrasound 05/24/2018, CT 10/21/2015

CLINICAL DATA: Laparoscopic cholecystectomy.

Ultrasound 05/24/2016 with concerns for cholecystitis.
EXAM:
MRI ABDOMEN WITHOUT AND WITH CONTRAST (INCLUDING MRCP)
TECHNIQUE: Multiplanar multisequence MR imaging of the abdomen was performed
both before and after the administration of intravenous contrast.
Heavily T2-weighted images of the biliary and pancreatic ducts were
obtained, and three-dimensional MRCP images were rendered by post
processing.
CONTRAST:  15mL MULTIHANCE GADOBENATE DIMEGLUMINE 529 MG/ML IV SOLN

[Series 3: cor haste · coronal · 6.0mm · 1.19mm/px · 1 of 33 slices shown]
[im 1/33]
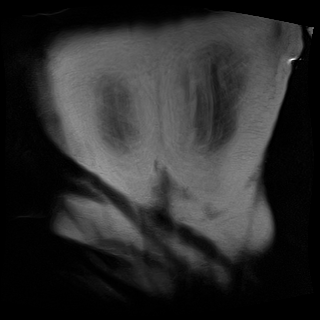

[Series 4: ax haste · axial · 6.0mm · 1.19mm/px · 1 of 38 slices shown]
[im 1/38]
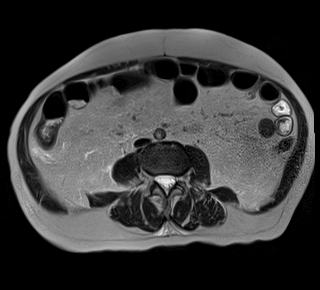

[Series 7: T2 fat-sat · axial · 6.0mm · 1.19mm/px · 1 of 38 slices shown]
[im 1/38]
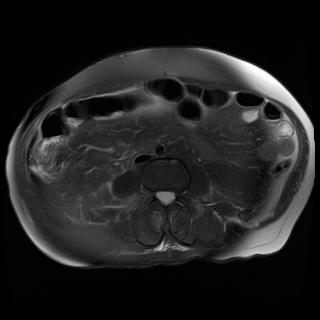

[Series 8: T1 · axial · 6.0mm · 0.74mm/px · 1 of 38 slices shown (1 of 2)]
[im 1/38]
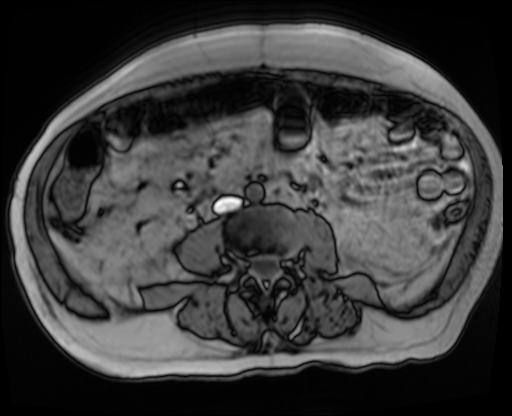

[Series 8: T1 · axial · 6.0mm · 0.74mm/px · 1 of 38 slices shown (2 of 2)]
[im 1/38]
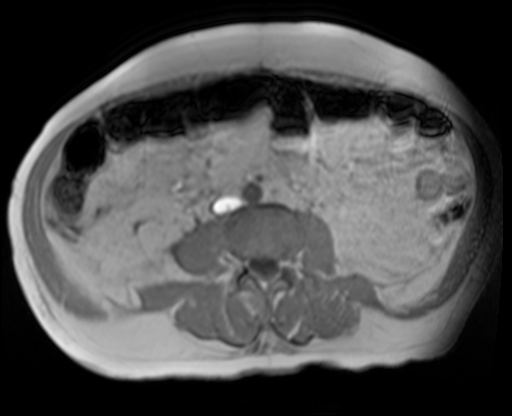

[Series 10: MRCP · coronal · 1.3mm · 0.59mm/px · 1 of 64 slices shown]
[im 1/64]
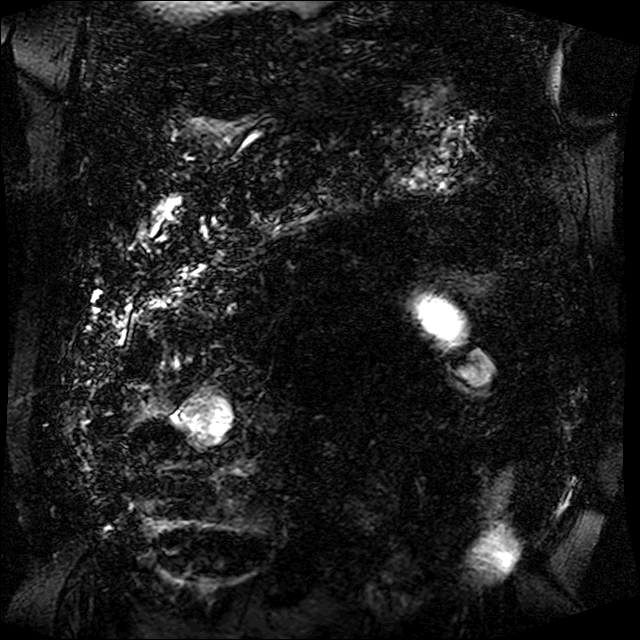

[Series 11: 3d mrcp_mip_radials · sagittal · 0.59mm/px · 1 of 18 slices shown]
[im 1/18]
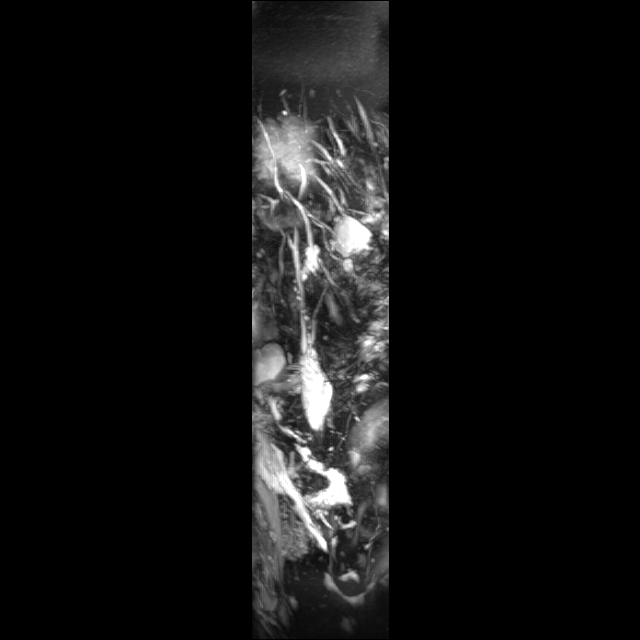

[Series 12: ax dwi_tracew · axial · 6.0mm · 1.42mm/px · 1 of 38 slices shown (1 of 3)]
[im 1/38]
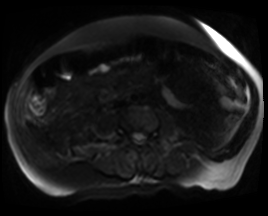

[Series 12: ax dwi_tracew · axial · 6.0mm · 1.42mm/px · 1 of 38 slices shown (2 of 3)]
[im 1/38]
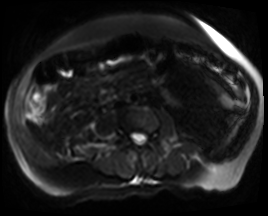

[Series 12: ax dwi_tracew · axial · 6.0mm · 1.42mm/px · 1 of 38 slices shown (3 of 3)]
[im 1/38]
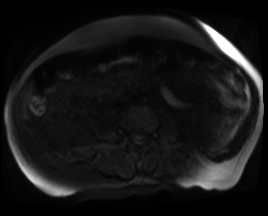

[Series 13: ax dwi_adc · axial · 6.0mm · 1.42mm/px · 1 of 38 slices shown]
[im 1/38]
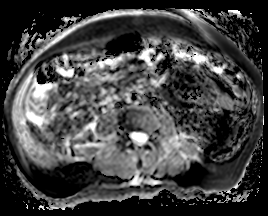

[Series 14: bSSFP · axial · 6.0mm · 0.74mm/px · 1 of 38 slices shown]
[im 1/38]
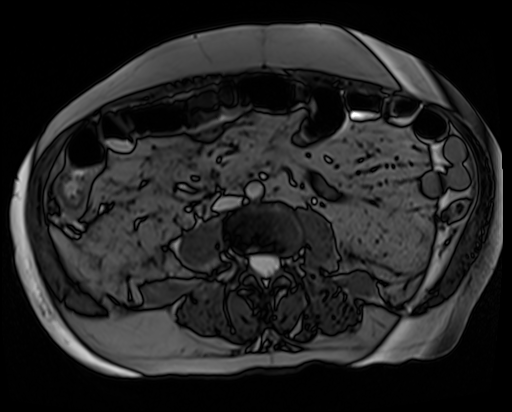

[Series 15: T1 dynamic fat-sat · axial · non-contrast · 4.0mm · 1.19mm/px · 1 of 72 slices shown (1 of 5)]
[im 1/72]
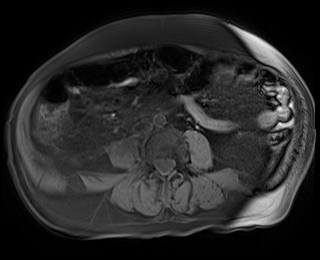

[Series 16: T1 dynamic fat-sat post-contrast · axial · 4.0mm · 1.19mm/px · 1 of 72 slices shown (1 of 4)]
[im 1/72]
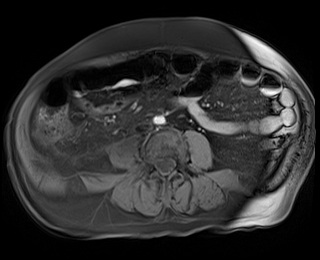

[Series 17: T1 dynamic fat-sat · axial · 4.0mm · 1.19mm/px · 1 of 72 slices shown (2 of 5)]
[im 1/72]
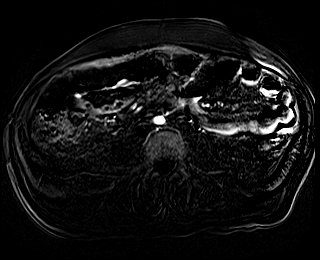

[Series 18: T1 dynamic fat-sat post-contrast · axial · 4.0mm · 1.19mm/px · 1 of 72 slices shown (2 of 4)]
[im 1/72]
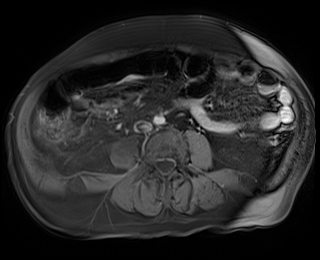

[Series 19: T1 dynamic fat-sat · axial · 4.0mm · 1.19mm/px · 1 of 72 slices shown (3 of 5)]
[im 1/72]
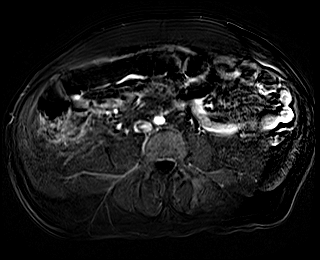

[Series 20: T1 dynamic fat-sat post-contrast · axial · 4.0mm · 1.19mm/px · 1 of 72 slices shown (3 of 4)]
[im 1/72]
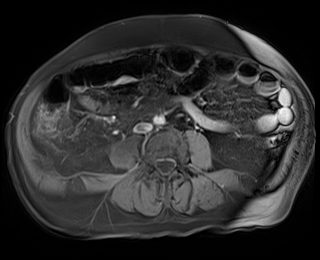

[Series 21: T1 dynamic fat-sat · axial · 4.0mm · 1.19mm/px · 1 of 72 slices shown (4 of 5)]
[im 1/72]
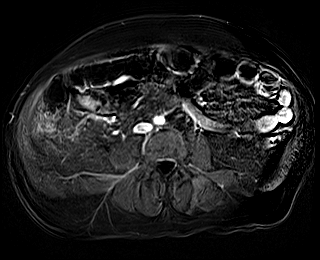

[Series 22: T1 dynamic post-contrast · coronal · 4.0mm · 1.31mm/px · 1 of 60 slices shown]
[im 1/60]
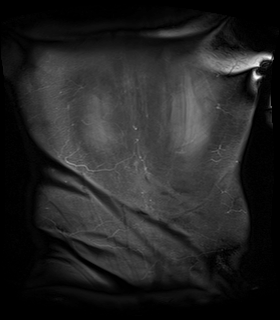

[Series 23: T1 dynamic fat-sat post-contrast · axial · 4.0mm · 1.19mm/px · 1 of 72 slices shown (4 of 4)]
[im 1/72]
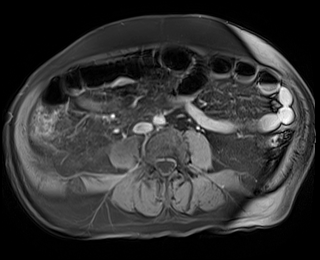

[Series 24: T1 dynamic fat-sat · axial · 4.0mm · 1.19mm/px · 1 of 72 slices shown (5 of 5)]
[im 1/72]
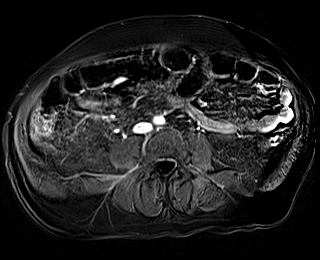

[22 of 22 positions shown; findings below may reference images not displayed]

FINDINGS: Lower chest: Lung bases are clear

Hepatobiliary: Interval cholecystectomy. Small amount fluid the
gallbladder fossa. No intrahepatic duct dilatation. The common
hepatic duct and common bile duct are normal caliber. No
choledocholithiasis.

Pancreas: Pancreas is normal. No ductal dilatation. No pancreatic
inflammation.

Spleen: Normal spleen

Adrenals/urinary tract: Adrenal glands and kidneys are normal. The
ureters and bladder normal.

Stomach/Bowel: Stomach normal. Duodenum normal. There is mildly
distended multiple loops of small bowel consistent postoperative
ileus.

Vascular/Lymphatic: Abdominal aorta is normal caliber. No periportal
or retroperitoneal adenopathy. No pelvic adenopathy.

Other: No free fluid.

Musculoskeletal: No aggressive osseous lesion.
IMPRESSION: 1. Post cholecystectomy with small amount of expected fluid in the
gallbladder fossa.
2. No intrahepatic or extrahepatic biliary duct dilatation. Common
bile duct is normal caliber. No choledocholithiasis.
3. Normal pancreas.
4. Dilated loops of small bowel consistent with postoperative ileus.

## 2020-03-30 ENCOUNTER — Other Ambulatory Visit: Payer: Self-pay

## 2020-03-30 ENCOUNTER — Ambulatory Visit (INDEPENDENT_AMBULATORY_CARE_PROVIDER_SITE_OTHER): Payer: Medicare Other | Admitting: Dermatology

## 2020-03-30 DIAGNOSIS — L578 Other skin changes due to chronic exposure to nonionizing radiation: Secondary | ICD-10-CM

## 2020-03-30 DIAGNOSIS — C44319 Basal cell carcinoma of skin of other parts of face: Secondary | ICD-10-CM | POA: Diagnosis not present

## 2020-03-30 DIAGNOSIS — D485 Neoplasm of uncertain behavior of skin: Secondary | ICD-10-CM

## 2020-03-30 DIAGNOSIS — C44310 Basal cell carcinoma of skin of unspecified parts of face: Secondary | ICD-10-CM

## 2020-03-30 NOTE — Progress Notes (Signed)
   Follow-Up Visit   Subjective  Joseph Clements is a 79 y.o. male who presents for the following: lesion (of the L temple that has been there for 8 weeks - painful and bleeds).  The following portions of the chart were reviewed this encounter and updated as appropriate:  Tobacco  Allergies  Meds  Problems  Med Hx  Surg Hx  Fam Hx      Review of Systems:  No other skin or systemic complaints except as noted in HPI or Assessment and Plan.  Objective  Well appearing patient in no apparent distress; mood and affect are within normal limits.  A focused examination was performed including the face. Relevant physical exam findings are noted in the Assessment and Plan.  Objective  L temple: 0.8 cm crusted papule   Assessment & Plan  Neoplasm of uncertain behavior of skin L temple  Epidermal / dermal shaving  Lesion diameter (cm):  0.8 Informed consent: discussed and consent obtained   Timeout: patient name, date of birth, surgical site, and procedure verified   Procedure prep:  Patient was prepped and draped in usual sterile fashion Prep type:  Isopropyl alcohol Anesthesia: the lesion was anesthetized in a standard fashion   Anesthetic:  1% lidocaine w/ epinephrine 1-100,000 buffered w/ 8.4% NaHCO3 Instrument used: flexible razor blade   Hemostasis achieved with: pressure, aluminum chloride and electrodesiccation   Outcome: patient tolerated procedure well   Post-procedure details: sterile dressing applied and wound care instructions given   Dressing type: bandage and petrolatum    Destruction of lesion Complexity: extensive   Destruction method: electrodesiccation and curettage   Informed consent: discussed and consent obtained   Timeout:  patient name, date of birth, surgical site, and procedure verified Procedure prep:  Patient was prepped and draped in usual sterile fashion Prep type:  Isopropyl alcohol Anesthesia: the lesion was anesthetized in a standard fashion    Anesthetic:  1% lidocaine w/ epinephrine 1-100,000 buffered w/ 8.4% NaHCO3 Curettage performed in three different directions: Yes   Electrodesiccation performed over the curetted area: Yes   Lesion length (cm):  0.8 Lesion width (cm):  0.8 Margin per side (cm):  0.2 Final wound size (cm):  1.2 Hemostasis achieved with:  pressure, aluminum chloride and electrodesiccation Outcome: patient tolerated procedure well with no complications   Post-procedure details: sterile dressing applied and wound care instructions given   Dressing type: bandage and petrolatum    Specimen 1 - Surgical pathology Differential Diagnosis: D48.5 r/o BCC vs other ED&C today  Check Margins: No 0.8 cm crusted papule  Actinic Damage - diffuse scaly erythematous macules with underlying dyspigmentation - Recommend daily broad spectrum sunscreen SPF 30+ to sun-exposed areas, reapply every 2 hours as needed.  - Call for new or changing lesions.  Return for appointment as scheduled.  Luther Redo, CMA, am acting as scribe for Sarina Ser, MD .  Documentation: I have reviewed the above documentation for accuracy and completeness, and I agree with the above.  Sarina Ser, MD

## 2020-03-30 NOTE — Patient Instructions (Signed)

## 2020-03-31 ENCOUNTER — Encounter: Payer: Self-pay | Admitting: Dermatology

## 2020-04-05 ENCOUNTER — Telehealth: Payer: Self-pay

## 2020-04-05 NOTE — Telephone Encounter (Signed)
Tried to call patient regarding pathology results - no answer/hd 

## 2020-04-05 NOTE — Telephone Encounter (Signed)
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

## 2020-04-06 ENCOUNTER — Telehealth: Payer: Self-pay

## 2020-04-06 NOTE — Telephone Encounter (Signed)
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

## 2020-04-06 NOTE — Telephone Encounter (Signed)
Patient informed of pathology results 

## 2020-08-05 IMAGING — US US ABDOMEN LIMITED
1 series · 14 of 25 positions shown · non-contrast
Comparison: Ultrasound December 03, 2015.

CLINICAL DATA: Acute right upper quadrant abdominal pain.

EXAM:
ULTRASOUND ABDOMEN LIMITED RIGHT UPPER QUADRANT

[Series 1: us abdomen limited · 0.23mm/px · 14 of 65 slices shown]
[im 1/65]
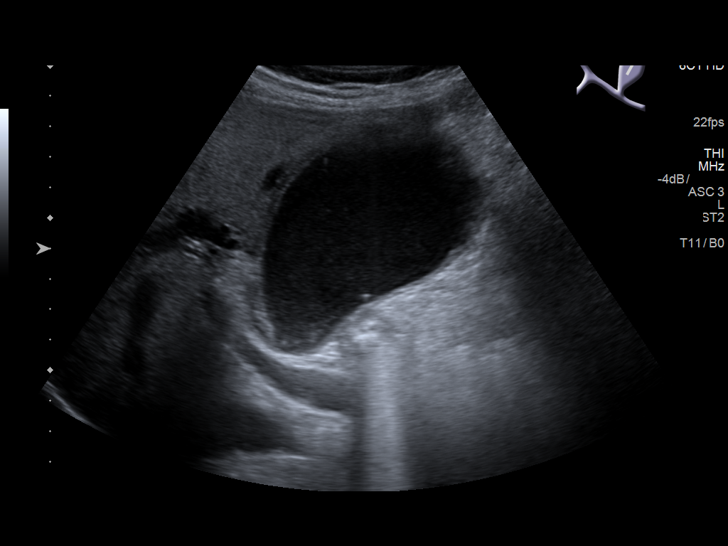
[im 6/65]
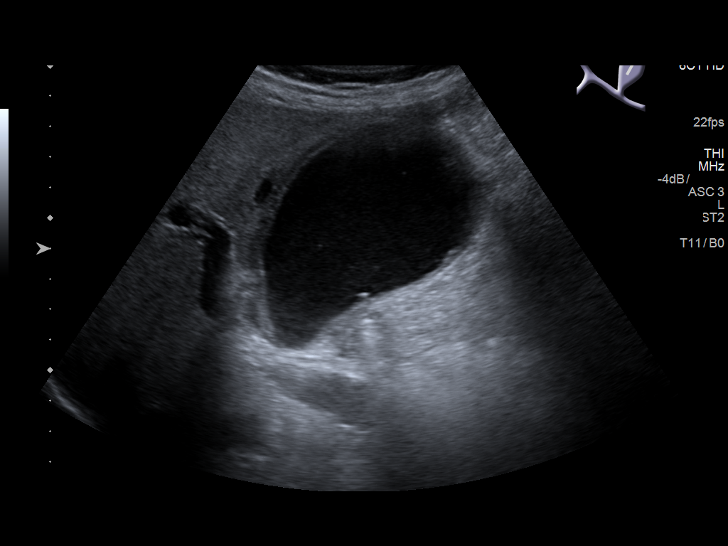
[im 11/65]
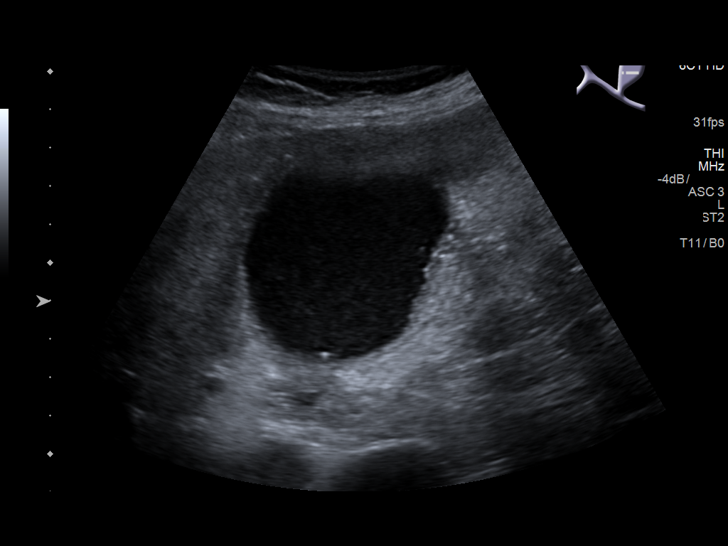
[im 17/65]
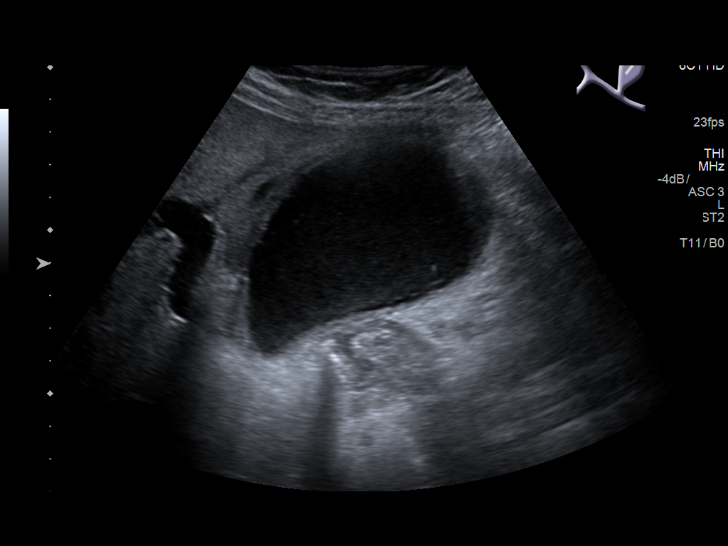
[im 22/65]
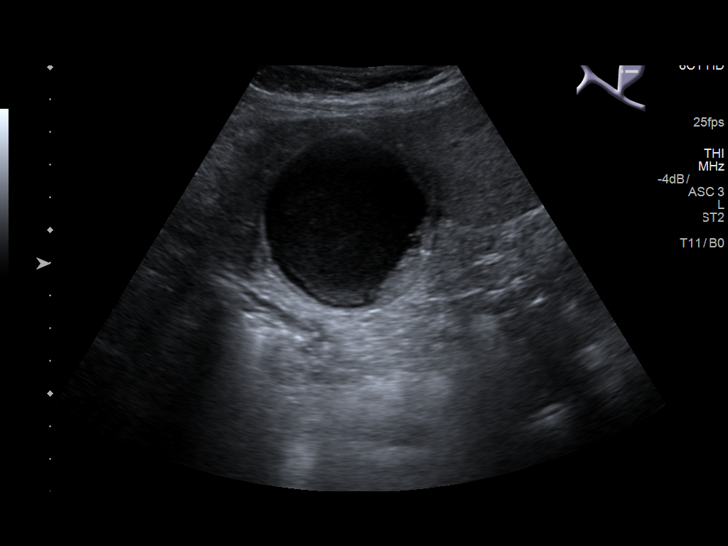
[im 25/65]
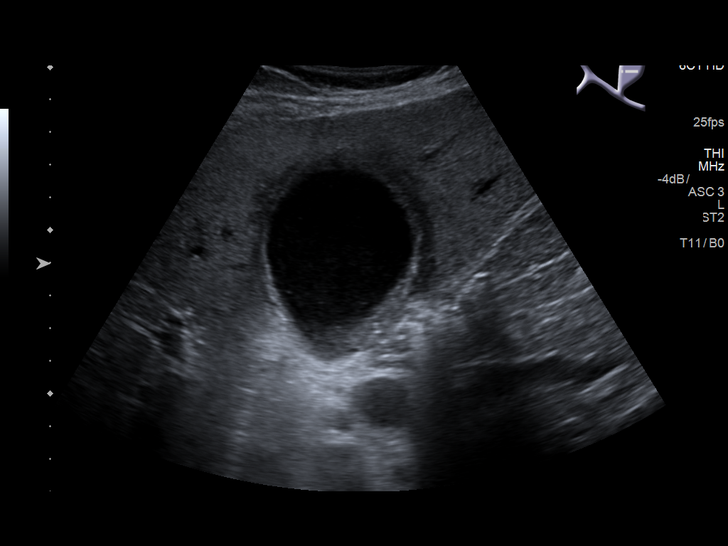
[im 30/65]
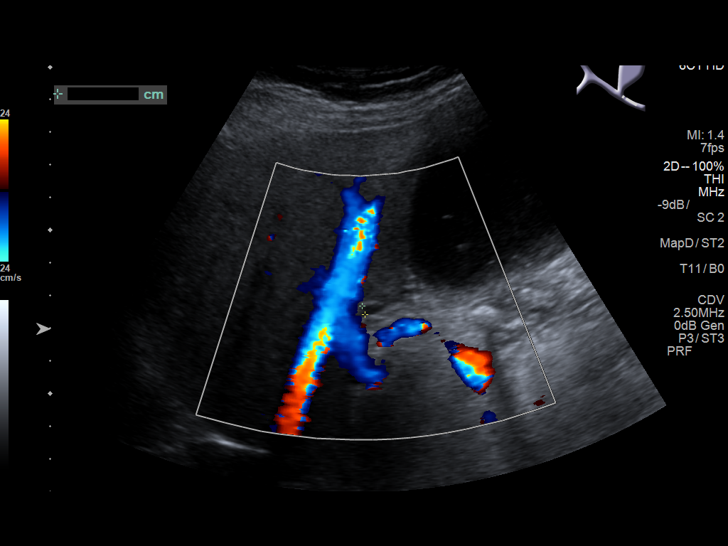
[im 35/65]
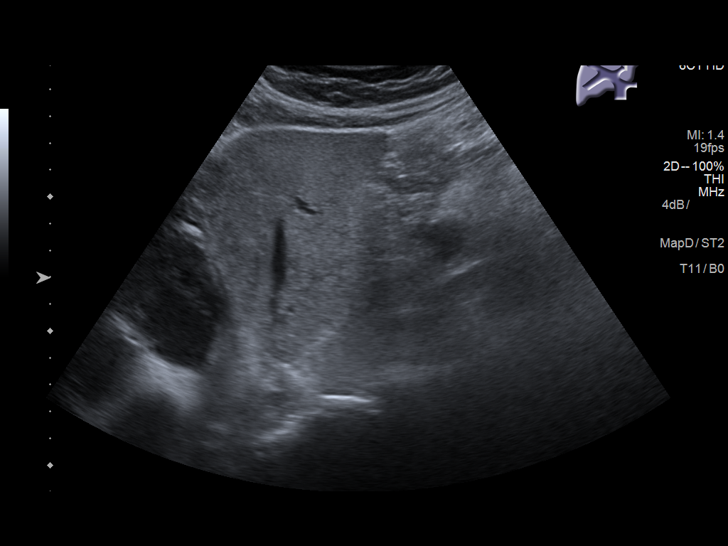
[im 41/65]
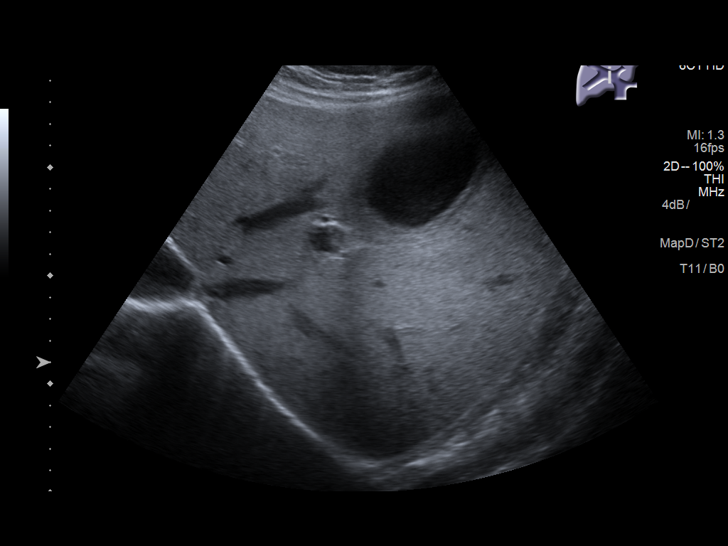
[im 43/65]
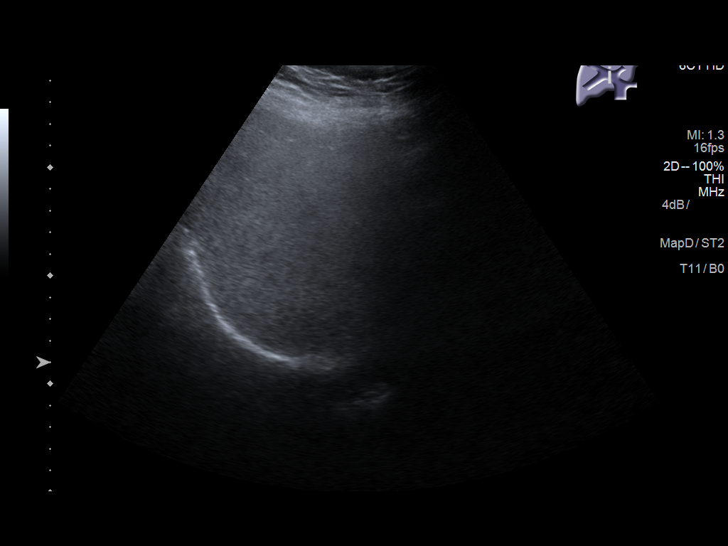
[im 49/65]
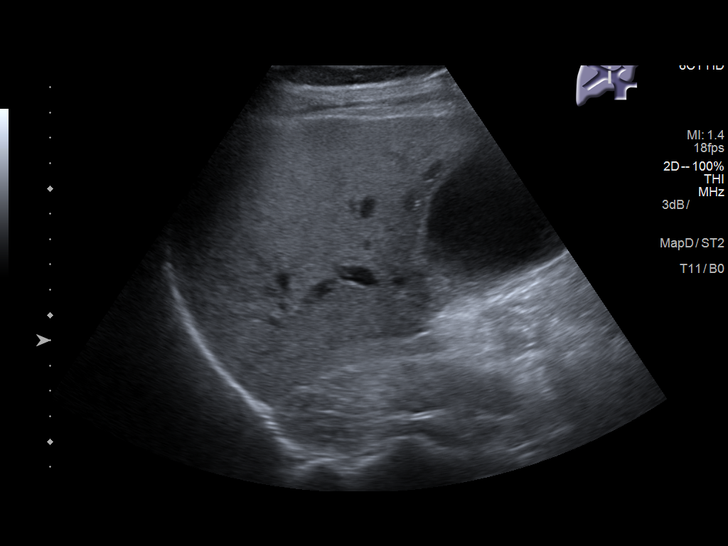
[im 54/65]
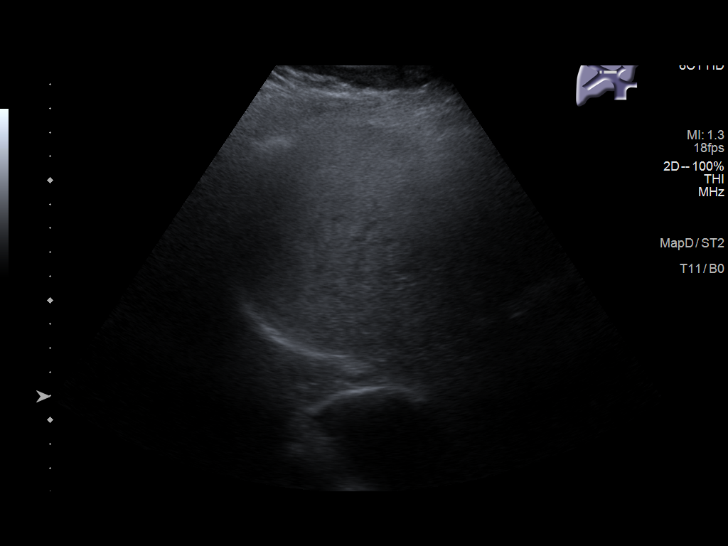
[im 59/65]
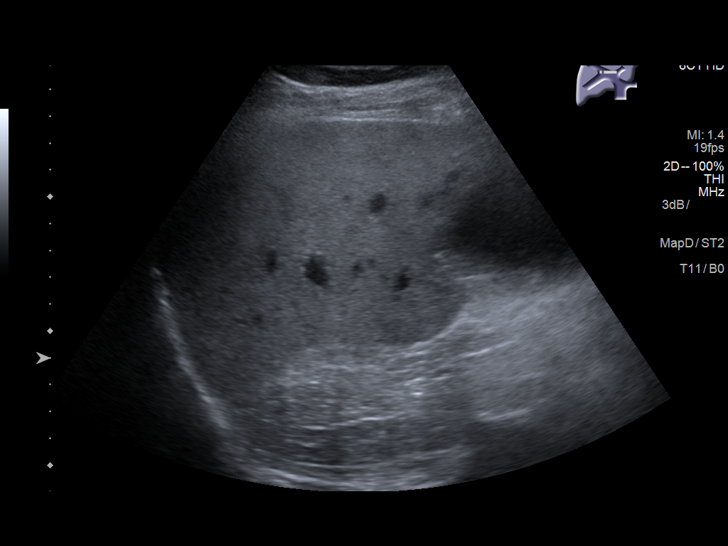
[im 65/65]
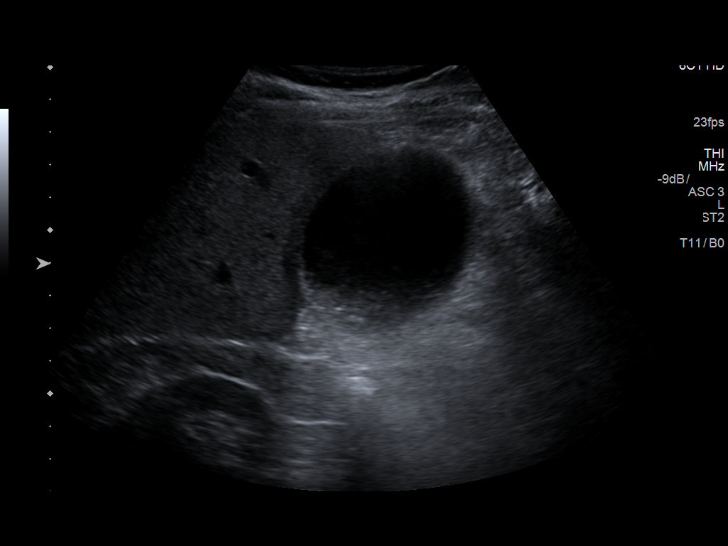

[14 of 25 positions shown; findings below may reference images not displayed]

FINDINGS: Gallbladder:

No gallstones visualized. Moderate gallbladder wall thickening is
noted at 5 mm. No sonographic Murphy sign noted by sonographer.
Sludge is noted within dilated gallbladder.

Common bile duct:

Diameter: 2.8 mm which is within normal limits.

Liver:

No focal lesion identified. Increased echogenicity of hepatic
parenchyma is noted suggesting fatty infiltration. Portal vein is
patent on color Doppler imaging with normal direction of blood flow
towards the liver.
IMPRESSION: Gallbladder distension and wall thickening is noted with sludge
present, but no gallstones. This is concerning for cholecystitis.
HIDA scan may be performed for further evaluation.

Increased echogenicity of hepatic parenchyma suggesting fatty
infiltration.

## 2020-10-18 ENCOUNTER — Ambulatory Visit: Payer: Medicare Other | Admitting: Dermatology

## 2020-10-18 ENCOUNTER — Other Ambulatory Visit: Payer: Self-pay

## 2020-10-18 DIAGNOSIS — Z86018 Personal history of other benign neoplasm: Secondary | ICD-10-CM

## 2020-10-18 DIAGNOSIS — L820 Inflamed seborrheic keratosis: Secondary | ICD-10-CM

## 2020-10-18 DIAGNOSIS — L570 Actinic keratosis: Secondary | ICD-10-CM

## 2020-10-18 DIAGNOSIS — D229 Melanocytic nevi, unspecified: Secondary | ICD-10-CM

## 2020-10-18 DIAGNOSIS — L821 Other seborrheic keratosis: Secondary | ICD-10-CM

## 2020-10-18 DIAGNOSIS — Z85828 Personal history of other malignant neoplasm of skin: Secondary | ICD-10-CM | POA: Diagnosis not present

## 2020-10-18 DIAGNOSIS — Z1283 Encounter for screening for malignant neoplasm of skin: Secondary | ICD-10-CM

## 2020-10-18 DIAGNOSIS — Z85820 Personal history of malignant melanoma of skin: Secondary | ICD-10-CM | POA: Diagnosis not present

## 2020-10-18 DIAGNOSIS — D1800 Hemangioma unspecified site: Secondary | ICD-10-CM

## 2020-10-18 DIAGNOSIS — L814 Other melanin hyperpigmentation: Secondary | ICD-10-CM

## 2020-10-18 DIAGNOSIS — L578 Other skin changes due to chronic exposure to nonionizing radiation: Secondary | ICD-10-CM

## 2020-10-18 NOTE — Progress Notes (Signed)
Follow-Up Visit   Subjective  Joseph Clements is a 80 y.o. male who presents for the following: TBSE (Patient here for full body skin exam and skin cancer screening. Patient does have a spot at right face and 2 on scalp. The one at right face gets irritated from glasses. The ones at scalp get irritated when combing hair. 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 ). The patient presents for Total-Body Skin Exam (TBSE) for skin cancer screening and mole check.  The following portions of the chart were reviewed this encounter and updated as appropriate:   Tobacco  Allergies  Meds  Problems  Med Hx  Surg Hx  Fam Hx     Review of Systems:  No other skin or systemic complaints except as noted in HPI or Assessment and Plan.  Objective  Well appearing patient in no apparent distress; mood and affect are within normal limits.  A full examination was performed including scalp, head, eyes, ears, nose, lips, neck, chest, axillae, abdomen, back, buttocks, bilateral upper extremities, bilateral lower extremities, hands, feet, fingers, toes, fingernails, and toenails. All findings within normal limits unless otherwise noted below.  Objective  Right Shoulder - Posterior x 1, scalp x 2, face x 15 (18): Erythematous keratotic or waxy stuck-on papule or plaque.   Objective  Face x 7 (7): Erythematous thin papules/macules with gritty scale.    Assessment & Plan  Inflamed seborrheic keratosis (18) Right Shoulder - Posterior x 1, scalp x 2, face x 15  Destruction of lesion - Right Shoulder - Posterior x 1, scalp x 2, face x 15 Complexity: simple   Destruction method: cryotherapy   Informed consent: discussed and consent obtained   Timeout:  patient name, date of birth, surgical site, and procedure verified Lesion destroyed using liquid nitrogen: Yes   Region frozen until ice ball extended beyond lesion: Yes   Outcome: patient tolerated procedure well with no complications    Post-procedure details: wound care instructions given    AK (actinic keratosis) (7) Face x 7  Destruction of lesion - Face x 7 Complexity: simple   Destruction method: cryotherapy   Informed consent: discussed and consent obtained   Timeout:  patient name, date of birth, surgical site, and procedure verified Lesion destroyed using liquid nitrogen: Yes   Region frozen until ice ball extended beyond lesion: Yes   Outcome: patient tolerated procedure well with no complications   Post-procedure details: wound care instructions given    Skin cancer screening   Lentigines - Scattered tan macules - Discussed due to sun exposure - Benign, observe - Call for any changes  Seborrheic Keratoses - Stuck-on, waxy, tan-brown papules and plaques  - Discussed benign etiology and prognosis. - Observe - Call for any changes  Melanocytic Nevi - Tan-brown and/or pink-flesh-colored symmetric macules and papules - Benign appearing on exam today - Observation - Call clinic for new or changing moles - Recommend daily use of broad spectrum spf 30+ sunscreen to sun-exposed areas.   Hemangiomas - Red papules - Discussed benign nature - Observe - Call for any changes  Actinic Damage - Chronic, secondary to cumulative UV/sun exposure - diffuse scaly erythematous macules with underlying dyspigmentation - Recommend daily broad spectrum sunscreen SPF 30+ to sun-exposed areas, reapply every 2 hours as needed.  - Call for new or changing lesions.  Skin cancer screening performed today.  History of Melanoma - No evidence of recurrence today at right lateral infrascapular - No  lymphadenopathy - Recommend regular full body skin exams - Recommend daily broad spectrum sunscreen SPF 30+ to sun-exposed areas, reapply every 2 hours as needed.  - Call if any new or changing lesions are noted between office visits  History of Dysplastic Nevi - No evidence of recurrence today - Recommend regular full  body skin exams - Recommend daily broad spectrum sunscreen SPF 30+ to sun-exposed areas, reapply every 2 hours as needed.  - Call if any new or changing lesions are noted between office visits  History of Basal Cell Carcinoma of the Skin - No evidence of recurrence today - Recommend regular full body skin exams - Recommend daily broad spectrum sunscreen SPF 30+ to sun-exposed areas, reapply every 2 hours as needed.  - Call if any new or changing lesions are noted between office visits  Return in about 6 months (around 04/17/2021) for sun exposed areas.  Graciella Belton, RMA, am acting as scribe for Sarina Ser, MD . Documentation: I have reviewed the above documentation for accuracy and completeness, and I agree with the above.  Sarina Ser, MD

## 2020-10-18 NOTE — Patient Instructions (Addendum)

## 2020-10-19 ENCOUNTER — Encounter: Payer: Self-pay | Admitting: Dermatology

## 2021-04-13 ENCOUNTER — Other Ambulatory Visit: Payer: Self-pay

## 2021-04-13 ENCOUNTER — Ambulatory Visit: Payer: Medicare Other | Admitting: Dermatology

## 2021-04-13 DIAGNOSIS — L578 Other skin changes due to chronic exposure to nonionizing radiation: Secondary | ICD-10-CM

## 2021-04-13 DIAGNOSIS — D1721 Benign lipomatous neoplasm of skin and subcutaneous tissue of right arm: Secondary | ICD-10-CM | POA: Diagnosis not present

## 2021-04-13 DIAGNOSIS — L570 Actinic keratosis: Secondary | ICD-10-CM

## 2021-04-13 DIAGNOSIS — L821 Other seborrheic keratosis: Secondary | ICD-10-CM

## 2021-04-13 DIAGNOSIS — L820 Inflamed seborrheic keratosis: Secondary | ICD-10-CM | POA: Diagnosis not present

## 2021-04-13 DIAGNOSIS — D1800 Hemangioma unspecified site: Secondary | ICD-10-CM

## 2021-04-13 DIAGNOSIS — D229 Melanocytic nevi, unspecified: Secondary | ICD-10-CM

## 2021-04-13 DIAGNOSIS — Z85820 Personal history of malignant melanoma of skin: Secondary | ICD-10-CM | POA: Diagnosis not present

## 2021-04-13 DIAGNOSIS — D485 Neoplasm of uncertain behavior of skin: Secondary | ICD-10-CM

## 2021-04-13 DIAGNOSIS — Z85828 Personal history of other malignant neoplasm of skin: Secondary | ICD-10-CM | POA: Diagnosis not present

## 2021-04-13 DIAGNOSIS — Z1283 Encounter for screening for malignant neoplasm of skin: Secondary | ICD-10-CM

## 2021-04-13 DIAGNOSIS — C44319 Basal cell carcinoma of skin of other parts of face: Secondary | ICD-10-CM

## 2021-04-13 DIAGNOSIS — L814 Other melanin hyperpigmentation: Secondary | ICD-10-CM

## 2021-04-13 DIAGNOSIS — Z86018 Personal history of other benign neoplasm: Secondary | ICD-10-CM

## 2021-04-13 NOTE — Patient Instructions (Signed)
Wound Care Instructions  Cleanse wound gently with soap and water once a day then pat dry with clean gauze. Apply a thing coat of Petrolatum (petroleum jelly, "Vaseline") over the wound (unless you have an allergy to this). We recommend that you use a new, sterile tube of Vaseline. Do not pick or remove scabs. Do not remove the yellow or white "healing tissue" from the base of the wound.  Cover the wound with fresh, clean, nonstick gauze and secure with paper tape. You may use Band-Aids in place of gauze and tape if the would is small enough, but would recommend trimming much of the tape off as there is often too much. Sometimes Band-Aids can irritate the skin.  You should call the office for your biopsy report after 1 week if you have not already been contacted.  If you experience any problems, such as abnormal amounts of bleeding, swelling, significant bruising, significant pain, or evidence of infection, please call the office immediately.  FOR ADULT SURGERY PATIENTS: If you need something for pain relief you may take 1 extra strength Tylenol (acetaminophen) AND 2 Ibuprofen (200mg each) together every 4 hours as needed for pain. (do not take these if you are allergic to them or if you have a reason you should not take them.) Typically, you may only need pain medication for 1 to 3 days.    Cryotherapy Aftercare  Wash gently with soap and water everyday.   Apply Vaseline and Band-Aid daily until healed.    If you have any questions or concerns for your doctor, please call our main line at [phone] and press option 4 to reach your doctor's medical assistant. If no one answers, please leave a voicemail as directed and we will return your call as soon as possible. Messages left after 4 pm will be answered the following business day.   You may also send us a message via MyChart. We typically respond to MyChart messages within 1-2 business days.  For prescription refills, please ask your  pharmacy to contact our office. Our fax number is [phone].  If you have an urgent issue when the clinic is closed that cannot wait until the next business day, you can page your doctor at the number below.    Please note that while we do our best to be available for urgent issues outside of office hours, we are not available 24/7.   If you have an urgent issue and are unable to reach us, you may choose to seek medical care at your doctor's office, retail clinic, urgent care center, or emergency room.  If you have a medical emergency, please immediately call 911 or go to the emergency department.  Pager Numbers  - Dr. Kowalski: [phone]  - Dr. Moye: [phone]  - Dr. Stewart: [phone]  In the event of inclement weather, please call our main line at [phone] for an update on the status of any delays or closures.  Dermatology Medication Tips: Please keep the boxes that topical medications come in in order to help keep track of the instructions about where and how to use these. Pharmacies typically print the medication instructions only on the boxes and not directly on the medication tubes.   If your medication is too expensive, please contact our office at [phone] option 4 or send us a message through MyChart.   We are unable to tell what your co-pay for medications will be in advance as this is different depending on your insurance coverage. However,   we may be able to find a substitute medication at lower cost or fill out paperwork to get insurance to cover a needed medication.   If a prior authorization is required to get your medication covered by your insurance company, please allow us 1-2 business days to complete this process.  Drug prices often vary depending on where the prescription is filled and some pharmacies may offer cheaper prices.  The website www.goodrx.com contains coupons for medications through different pharmacies. The prices here do not  account for what the cost may be with help from insurance (it may be cheaper with your insurance), but the website can give you the price if you did not use any insurance.  - You can print the associated coupon and take it with your prescription to the pharmacy.  - You may also stop by our office during regular business hours and pick up a GoodRx coupon card.  - If you need your prescription sent electronically to a different pharmacy, notify our office through [facility] MyChart or by phone at [phone] option 4.  

## 2021-04-13 NOTE — Progress Notes (Signed)
Follow-Up Visit   Subjective  Joseph Clements is a 80 y.o. male who presents for the following: Follow-up (History of Melanoma, BCC, Dysplastic nevus - UBSE today) The patient presents for Upper Body Skin Exam (UBSE) for skin cancer screening and mole check.  The following portions of the chart were reviewed this encounter and updated as appropriate:   Tobacco  Allergies  Meds  Problems  Med Hx  Surg Hx  Fam Hx     Review of Systems:  No other skin or systemic complaints except as noted in HPI or Assessment and Plan.  Objective  Well appearing patient in no apparent distress; mood and affect are within normal limits.  All skin waist up examined.  Right top of shoulder 7.0 cm rubbery nodule  Left Forehead x 2 Erythematous keratotic or waxy stuck-on papule or plaque.   Face (8) Erythematous thin papules/macules with gritty scale.   Left Temple 0.6 cm papule        Assessment & Plan   History of Melanoma - No evidence of recurrence today - No lymphadenopathy - Recommend regular full body skin exams - Recommend daily broad spectrum sunscreen SPF 30+ to sun-exposed areas, reapply every 2 hours as needed.  - Call if any new or changing lesions are noted between office visits  History of Basal Cell Carcinoma of the Skin - No evidence of recurrence today - Recommend regular full body skin exams - Recommend daily broad spectrum sunscreen SPF 30+ to sun-exposed areas, reapply every 2 hours as needed.  - Call if any new or changing lesions are noted between office visits  History of Dysplastic Nevi - No evidence of recurrence today - Recommend regular full body skin exams - Recommend daily broad spectrum sunscreen SPF 30+ to sun-exposed areas, reapply every 2 hours as needed.  - Call if any new or changing lesions are noted between office visits  Lentigines - Scattered tan macules - Due to sun exposure - Benign-appering, observe - Recommend daily broad  spectrum sunscreen SPF 30+ to sun-exposed areas, reapply every 2 hours as needed. - Call for any changes  Seborrheic Keratoses - Stuck-on, waxy, tan-brown papules and/or plaques  - Benign-appearing - Discussed benign etiology and prognosis. - Observe - Call for any changes  Melanocytic Nevi - Tan-brown and/or pink-flesh-colored symmetric macules and papules - Benign appearing on exam today - Observation - Call clinic for new or changing moles - Recommend daily use of broad spectrum spf 30+ sunscreen to sun-exposed areas.   Hemangiomas - Red papules - Discussed benign nature - Observe - Call for any changes  Actinic Damage - Chronic condition, secondary to cumulative UV/sun exposure - diffuse scaly erythematous macules with underlying dyspigmentation - Recommend daily broad spectrum sunscreen SPF 30+ to sun-exposed areas, reapply every 2 hours as needed.  - Staying in the shade or wearing long sleeves, sun glasses (UVA+UVB protection) and wide brim hats (4-inch brim around the entire circumference of the hat) are also recommended for sun protection.  - Call for new or changing lesions.  Skin cancer screening performed today.  Lipoma of right upper extremity Right top of shoulder Benign-appearing.  Observation.  Call clinic for new or changing lesions.  Recommend daily use of broad spectrum spf 30+ sunscreen to sun-exposed areas.   Inflamed seborrheic keratosis Left Forehead x 2  Destruction of lesion - Left Forehead x 2 Complexity: simple   Destruction method: cryotherapy   Informed consent: discussed and consent obtained  Timeout:  patient name, date of birth, surgical site, and procedure verified Lesion destroyed using liquid nitrogen: Yes   Region frozen until ice ball extended beyond lesion: Yes   Outcome: patient tolerated procedure well with no complications   Post-procedure details: wound care instructions given    AK (actinic keratosis)  (8) Face  Destruction of lesion - Face Complexity: simple   Destruction method: cryotherapy   Informed consent: discussed and consent obtained   Timeout:  patient name, date of birth, surgical site, and procedure verified Lesion destroyed using liquid nitrogen: Yes   Region frozen until ice ball extended beyond lesion: Yes   Outcome: patient tolerated procedure well with no complications   Post-procedure details: wound care instructions given    Neoplasm of uncertain behavior of skin Left Temple Epidermal / dermal shaving  Lesion diameter (cm):  0.6 Informed consent: discussed and consent obtained   Timeout: patient name, date of birth, surgical site, and procedure verified   Procedure prep:  Patient was prepped and draped in usual sterile fashion Prep type:  Isopropyl alcohol Anesthesia: the lesion was anesthetized in a standard fashion   Anesthetic:  1% lidocaine w/ epinephrine 1-100,000 buffered w/ 8.4% NaHCO3 Instrument used: flexible razor blade   Hemostasis achieved with: pressure, aluminum chloride and electrodesiccation   Outcome: patient tolerated procedure well   Post-procedure details: sterile dressing applied and wound care instructions given   Dressing type: bandage and petrolatum    Specimen 1 - Surgical pathology Differential Diagnosis: Nevus vs BCC vs other Check Margins: No  Return in about 6 months (around 10/14/2021) for TBSE.  I, Ashok Cordia, CMA, am acting as scribe for Sarina Ser, MD . Documentation: I have reviewed the above documentation for accuracy and completeness, and I agree with the above.  Sarina Ser, MD

## 2021-04-17 ENCOUNTER — Encounter: Payer: Self-pay | Admitting: Dermatology

## 2021-04-21 ENCOUNTER — Telehealth: Payer: Self-pay

## 2021-04-21 NOTE — Telephone Encounter (Signed)
-----   Message from Ralene Bathe, MD sent at 04/20/2021  1:52 PM EDT ----- Diagnosis Skin , left temple BASAL CELL CARCINOMA, NODULAR PATTERN, BASE INVOLVED  Cancer - BCC Schedule for treatment (EDC)

## 2021-04-21 NOTE — Telephone Encounter (Signed)
Advised patient of results and scheduled for EDC/hd  

## 2021-05-16 ENCOUNTER — Other Ambulatory Visit: Payer: Self-pay

## 2021-05-16 ENCOUNTER — Encounter: Payer: Self-pay | Admitting: Dermatology

## 2021-05-16 ENCOUNTER — Ambulatory Visit: Payer: Medicare Other | Admitting: Dermatology

## 2021-05-16 DIAGNOSIS — L578 Other skin changes due to chronic exposure to nonionizing radiation: Secondary | ICD-10-CM

## 2021-05-16 DIAGNOSIS — C44319 Basal cell carcinoma of skin of other parts of face: Secondary | ICD-10-CM | POA: Diagnosis not present

## 2021-05-16 NOTE — Progress Notes (Signed)
   Follow-Up Visit   Subjective  Joseph Clements is a 80 y.o. male who presents for the following: Basal Cell Carcinoma (Biopsy proven of left temple - EDC today).  The following portions of the chart were reviewed this encounter and updated as appropriate:   Tobacco  Allergies  Meds  Problems  Med Hx  Surg Hx  Fam Hx     Review of Systems:  No other skin or systemic complaints except as noted in HPI or Assessment and Plan.  Objective  Well appearing patient in no apparent distress; mood and affect are within normal limits.  A focused examination was performed including face. Relevant physical exam findings are noted in the Assessment and Plan.  Left Temple Healing biopsy site   Assessment & Plan   Actinic Damage - chronic, secondary to cumulative UV radiation exposure/sun exposure over time - diffuse scaly erythematous macules with underlying dyspigmentation - Recommend daily broad spectrum sunscreen SPF 30+ to sun-exposed areas, reapply every 2 hours as needed.  - Recommend staying in the shade or wearing long sleeves, sun glasses (UVA+UVB protection) and wide brim hats (4-inch brim around the entire circumference of the hat). - Call for new or changing lesions.  Basal cell carcinoma (BCC) of skin of other part of face Left Temple  Destruction of lesion Complexity: extensive   Destruction method: electrodesiccation and curettage   Informed consent: discussed and consent obtained   Timeout:  patient name, date of birth, surgical site, and procedure verified Procedure prep:  Patient was prepped and draped in usual sterile fashion Prep type:  Isopropyl alcohol Anesthesia: the lesion was anesthetized in a standard fashion   Anesthetic:  1% lidocaine w/ epinephrine 1-100,000 buffered w/ 8.4% NaHCO3 Curettage performed in three different directions: Yes   Electrodesiccation performed over the curetted area: Yes   Final wound size (cm):  1.1 Hemostasis achieved with:   pressure and aluminum chloride Outcome: patient tolerated procedure well with no complications   Post-procedure details: sterile dressing applied and wound care instructions given   Dressing type: bandage and petrolatum    Return for Follow up as scheduled.  I, Ashok Cordia, CMA, am acting as scribe for Sarina Ser, MD . Documentation: I have reviewed the above documentation for accuracy and completeness, and I agree with the above.  Sarina Ser, MD

## 2021-05-16 NOTE — Patient Instructions (Signed)
Wound Care Instructions  Cleanse wound gently with soap and water once a day then pat dry with clean gauze. Apply a thing coat of Petrolatum (petroleum jelly, "Vaseline") over the wound (unless you have an allergy to this). We recommend that you use a new, sterile tube of Vaseline. Do not pick or remove scabs. Do not remove the yellow or white "healing tissue" from the base of the wound.  Cover the wound with fresh, clean, nonstick gauze and secure with paper tape. You may use Band-Aids in place of gauze and tape if the would is small enough, but would recommend trimming much of the tape off as there is often too much. Sometimes Band-Aids can irritate the skin.  You should call the office for your biopsy report after 1 week if you have not already been contacted.  If you experience any problems, such as abnormal amounts of bleeding, swelling, significant bruising, significant pain, or evidence of infection, please call the office immediately.  FOR ADULT SURGERY PATIENTS: If you need something for pain relief you may take 1 extra strength Tylenol (acetaminophen) AND 2 Ibuprofen (200mg each) together every 4 hours as needed for pain. (do not take these if you are allergic to them or if you have a reason you should not take them.) Typically, you may only need pain medication for 1 to 3 days.   If you have any questions or concerns for your doctor, please call our main line at [phone] and press option 4 to reach your doctor's medical assistant. If no one answers, please leave a voicemail as directed and we will return your call as soon as possible. Messages left after 4 pm will be answered the following business day.   You may also send us a message via MyChart. We typically respond to MyChart messages within 1-2 business days.  For prescription refills, please ask your pharmacy to contact our office. Our fax number is [phone].  If you have an urgent issue when the clinic is closed that  cannot wait until the next business day, you can page your doctor at the number below.    Please note that while we do our best to be available for urgent issues outside of office hours, we are not available 24/7.   If you have an urgent issue and are unable to reach us, you may choose to seek medical care at your doctor's office, retail clinic, urgent care center, or emergency room.  If you have a medical emergency, please immediately call 911 or go to the emergency department.  Pager Numbers  - Dr. Kowalski: [phone]  - Dr. Moye: [phone]  - Dr. Stewart: [phone]  In the event of inclement weather, please call our main line at [phone] for an update on the status of any delays or closures.  Dermatology Medication Tips: Please keep the boxes that topical medications come in in order to help keep track of the instructions about where and how to use these. Pharmacies typically print the medication instructions only on the boxes and not directly on the medication tubes.   If your medication is too expensive, please contact our office at [phone] option 4 or send us a message through MyChart.   We are unable to tell what your co-pay for medications will be in advance as this is different depending on your insurance coverage. However, we may be able to find a substitute medication at lower cost or fill out paperwork to get insurance to cover a needed   medication.   If a prior authorization is required to get your medication covered by your insurance company, please allow us 1-2 business days to complete this process.  Drug prices often vary depending on where the prescription is filled and some pharmacies may offer cheaper prices.  The website www.goodrx.com contains coupons for medications through different pharmacies. The prices here do not account for what the cost may be with help from insurance (it may be cheaper with your insurance), but the website can give you the  price if you did not use any insurance.  - You can print the associated coupon and take it with your prescription to the pharmacy.  - You may also stop by our office during regular business hours and pick up a GoodRx coupon card.  - If you need your prescription sent electronically to a different pharmacy, notify our office through [facility] MyChart or by phone at [phone] option 4.   

## 2021-05-18 ENCOUNTER — Encounter: Payer: Self-pay | Admitting: Dermatology

## 2021-10-12 ENCOUNTER — Other Ambulatory Visit: Payer: Self-pay

## 2021-10-12 ENCOUNTER — Ambulatory Visit: Payer: Medicare Other | Admitting: Dermatology

## 2021-10-12 DIAGNOSIS — Z1283 Encounter for screening for malignant neoplasm of skin: Secondary | ICD-10-CM | POA: Diagnosis not present

## 2021-10-12 DIAGNOSIS — L578 Other skin changes due to chronic exposure to nonionizing radiation: Secondary | ICD-10-CM

## 2021-10-12 DIAGNOSIS — L814 Other melanin hyperpigmentation: Secondary | ICD-10-CM

## 2021-10-12 DIAGNOSIS — Z85828 Personal history of other malignant neoplasm of skin: Secondary | ICD-10-CM

## 2021-10-12 DIAGNOSIS — L821 Other seborrheic keratosis: Secondary | ICD-10-CM

## 2021-10-12 DIAGNOSIS — L820 Inflamed seborrheic keratosis: Secondary | ICD-10-CM | POA: Diagnosis not present

## 2021-10-12 DIAGNOSIS — Z86018 Personal history of other benign neoplasm: Secondary | ICD-10-CM

## 2021-10-12 DIAGNOSIS — Z85820 Personal history of malignant melanoma of skin: Secondary | ICD-10-CM | POA: Diagnosis not present

## 2021-10-12 DIAGNOSIS — L570 Actinic keratosis: Secondary | ICD-10-CM

## 2021-10-12 DIAGNOSIS — D229 Melanocytic nevi, unspecified: Secondary | ICD-10-CM

## 2021-10-12 DIAGNOSIS — D1800 Hemangioma unspecified site: Secondary | ICD-10-CM

## 2021-10-12 NOTE — Progress Notes (Signed)
Follow-Up Visit   Subjective  Joseph Clements is a 81 y.o. male who presents for the following: Annual Exam (Patient has noticed some dark spots on the back and rough patches on the face that he would like checked today. ). The patient presents for Total-Body Skin Exam (TBSE) for skin cancer screening and mole check.  The patient has spots, moles and lesions to be evaluated, some may be new or changing and the patient has concerns that these could be cancer.  The following portions of the chart were reviewed this encounter and updated as appropriate:   Tobacco   Allergies   Meds   Problems   Med Hx   Surg Hx   Fam Hx      Review of Systems:  No other skin or systemic complaints except as noted in HPI or Assessment and Plan.  Objective  Well appearing patient in no apparent distress; mood and affect are within normal limits.  A full examination was performed including scalp, head, eyes, ears, nose, lips, neck, chest, axillae, abdomen, back, buttocks, bilateral upper extremities, bilateral lower extremities, hands, feet, fingers, toes, fingernails, and toenails. All findings within normal limits unless otherwise noted below.  R forehead x 1, scalp x 1 (2) Erythematous thin papules/macules with gritty scale.   R sideburn x 1 Erythematous stuck-on, waxy papule or plaque   Assessment & Plan  AK (actinic keratosis) (2) R forehead x 1, scalp x 1  Destruction of lesion - R forehead x 1, scalp x 1 Complexity: simple   Destruction method: cryotherapy   Informed consent: discussed and consent obtained   Timeout:  patient name, date of birth, surgical site, and procedure verified Lesion destroyed using liquid nitrogen: Yes   Region frozen until ice ball extended beyond lesion: Yes   Outcome: patient tolerated procedure well with no complications   Post-procedure details: wound care instructions given    Inflamed seborrheic keratosis R sideburn x 1  Destruction of lesion - R sideburn x  1 Complexity: simple   Destruction method: cryotherapy   Informed consent: discussed and consent obtained   Timeout:  patient name, date of birth, surgical site, and procedure verified Lesion destroyed using liquid nitrogen: Yes   Region frozen until ice ball extended beyond lesion: Yes   Outcome: patient tolerated procedure well with no complications   Post-procedure details: wound care instructions given    Skin cancer screening  Lentigines - Scattered tan macules - Due to sun exposure - Benign-appearing, observe - Recommend daily broad spectrum sunscreen SPF 30+ to sun-exposed areas, reapply every 2 hours as needed. - Call for any changes  Seborrheic Keratoses - Stuck-on, waxy, tan-brown papules and/or plaques  - Benign-appearing - Discussed benign etiology and prognosis. - Observe - Call for any changes  Melanocytic Nevi - Tan-brown and/or pink-flesh-colored symmetric macules and papules - Benign appearing on exam today - Observation - Call clinic for new or changing moles - Recommend daily use of broad spectrum spf 30+ sunscreen to sun-exposed areas.   Hemangiomas - Red papules - Discussed benign nature - Observe - Call for any changes  Actinic Damage - Chronic condition, secondary to cumulative UV/sun exposure - diffuse scaly erythematous macules with underlying dyspigmentation - Recommend daily broad spectrum sunscreen SPF 30+ to sun-exposed areas, reapply every 2 hours as needed.  - Staying in the shade or wearing long sleeves, sun glasses (UVA+UVB protection) and wide brim hats (4-inch brim around the entire circumference of the hat) are  also recommended for sun protection.  - Call for new or changing lesions.  History of Basal Cell Carcinoma of the Skin - No evidence of recurrence today - Recommend regular full body skin exams - Recommend daily broad spectrum sunscreen SPF 30+ to sun-exposed areas, reapply every 2 hours as needed.  - Call if any new or  changing lesions are noted between office visits  History of Melanoma - No evidence of recurrence today - No lymphadenopathy - Recommend regular full body skin exams - Recommend daily broad spectrum sunscreen SPF 30+ to sun-exposed areas, reapply every 2 hours as needed.  - Call if any new or changing lesions are noted between office visits  History of Dysplastic Nevi - No evidence of recurrence today - Recommend regular full body skin exams - Recommend daily broad spectrum sunscreen SPF 30+ to sun-exposed areas, reapply every 2 hours as needed.  - Call if any new or changing lesions are noted between office visits  Skin cancer screening performed today.  Return in about 1 year (around 10/12/2022) for TBSE.  Luther Redo, CMA, am acting as scribe for Sarina Ser, MD . Documentation: I have reviewed the above documentation for accuracy and completeness, and I agree with the above.  Sarina Ser, MD

## 2021-10-12 NOTE — Patient Instructions (Signed)

## 2021-10-15 ENCOUNTER — Encounter: Payer: Self-pay | Admitting: Dermatology

## 2021-10-16 ENCOUNTER — Emergency Department: Payer: Medicare Other

## 2021-10-16 ENCOUNTER — Emergency Department
Admission: EM | Admit: 2021-10-16 | Discharge: 2021-10-16 | Disposition: A | Discharge status: Home / Self Care | Payer: Medicare Other | Attending: Emergency Medicine | Admitting: Emergency Medicine

## 2021-10-16 ENCOUNTER — Other Ambulatory Visit: Payer: Self-pay

## 2021-10-16 ENCOUNTER — Encounter: Payer: Self-pay | Admitting: Emergency Medicine

## 2021-10-16 DIAGNOSIS — N132 Hydronephrosis with renal and ureteral calculous obstruction: Secondary | ICD-10-CM | POA: Diagnosis not present

## 2021-10-16 DIAGNOSIS — I10 Essential (primary) hypertension: Secondary | ICD-10-CM | POA: Diagnosis not present

## 2021-10-16 DIAGNOSIS — N200 Calculus of kidney: Secondary | ICD-10-CM

## 2021-10-16 DIAGNOSIS — M545 Low back pain, unspecified: Secondary | ICD-10-CM | POA: Diagnosis present

## 2021-10-16 DIAGNOSIS — R739 Hyperglycemia, unspecified: Secondary | ICD-10-CM

## 2021-10-16 DIAGNOSIS — E1165 Type 2 diabetes mellitus with hyperglycemia: Secondary | ICD-10-CM | POA: Diagnosis not present

## 2021-10-16 LAB — CBC WITH DIFFERENTIAL/PLATELET
Abs Immature Granulocytes: 0.04 10*3/uL (ref 0.00–0.07)
Basophils Absolute: 0.1 10*3/uL (ref 0.0–0.1)
Basophils Relative: 1 %
Eosinophils Absolute: 0 10*3/uL (ref 0.0–0.5)
Eosinophils Relative: 1 %
HCT: 47.9 % (ref 39.0–52.0)
Hemoglobin: 15.4 g/dL (ref 13.0–17.0)
Immature Granulocytes: 1 %
Lymphocytes Relative: 19 %
Lymphs Abs: 1.2 10*3/uL (ref 0.7–4.0)
MCH: 27.4 pg (ref 26.0–34.0)
MCHC: 32.2 g/dL (ref 30.0–36.0)
MCV: 85.1 fL (ref 80.0–100.0)
Monocytes Absolute: 0.4 10*3/uL (ref 0.1–1.0)
Monocytes Relative: 6 %
Neutro Abs: 4.7 10*3/uL (ref 1.7–7.7)
Neutrophils Relative %: 72 %
Platelets: 236 10*3/uL (ref 150–400)
RBC: 5.63 MIL/uL (ref 4.22–5.81)
RDW: 13 % (ref 11.5–15.5)
WBC: 6.4 10*3/uL (ref 4.0–10.5)
nRBC: 0 % (ref 0.0–0.2)

## 2021-10-16 LAB — COMPREHENSIVE METABOLIC PANEL
ALT: 35 U/L (ref 0–44)
AST: 22 U/L (ref 15–41)
Albumin: 4.2 g/dL (ref 3.5–5.0)
Alkaline Phosphatase: 109 U/L (ref 38–126)
Anion gap: 11 (ref 5–15)
BUN: 26 mg/dL — ABNORMAL HIGH (ref 8–23)
CO2: 23 mmol/L (ref 22–32)
Calcium: 9.4 mg/dL (ref 8.9–10.3)
Chloride: 104 mmol/L (ref 98–111)
Creatinine, Ser: 1.44 mg/dL — ABNORMAL HIGH (ref 0.61–1.24)
GFR, Estimated: 49 mL/min — ABNORMAL LOW (ref >60)
Glucose, Bld: 210 mg/dL — ABNORMAL HIGH (ref 70–99)
Potassium: 4 mmol/L (ref 3.5–5.1)
Sodium: 138 mmol/L (ref 135–145)
Total Bilirubin: 0.4 mg/dL (ref 0.3–1.2)
Total Protein: 7.8 g/dL (ref 6.5–8.1)

## 2021-10-16 LAB — URINALYSIS, COMPLETE (UACMP) WITH MICROSCOPIC
Bacteria, UA: NONE SEEN
Bilirubin Urine: NEGATIVE
Glucose, UA: 150 mg/dL — AB
Ketones, ur: NEGATIVE mg/dL
Leukocytes,Ua: NEGATIVE
Nitrite: NEGATIVE
Protein, ur: 30 mg/dL — AB
RBC / HPF: >50 RBC/hpf — ABNORMAL HIGH (ref 0–5)
Specific Gravity, Urine: 1.011 (ref 1.005–1.030)
pH: 7 (ref 5.0–8.0)

## 2021-10-16 LAB — MAGNESIUM: Magnesium: 2 mg/dL (ref 1.7–2.4)

## 2021-10-16 MED ORDER — ONDANSETRON 4 MG PO TBDP: 4.0000 mg | ORAL_TABLET | ORAL | 0 refills | Status: DC | PRN

## 2021-10-16 MED ORDER — MORPHINE SULFATE (PF) 4 MG/ML IV SOLN
4.0000 mg | Freq: Once | INTRAVENOUS | Status: AC
  Administered 2021-10-16: 4 mg via INTRAVENOUS
  Filled 2021-10-16: qty 1

## 2021-10-16 MED ORDER — ONDANSETRON HCL 4 MG/2ML IJ SOLN
4.0000 mg | Freq: Once | INTRAMUSCULAR | Status: AC
  Administered 2021-10-16: 4 mg via INTRAVENOUS
  Filled 2021-10-16: qty 2

## 2021-10-16 MED ORDER — LACTATED RINGERS IV BOLUS
500.0000 mL | Freq: Once | INTRAVENOUS | Status: AC
  Administered 2021-10-16: 500 mL via INTRAVENOUS

## 2021-10-16 MED ORDER — HYDROCODONE-ACETAMINOPHEN 5-325 MG PO TABS: 1.0000 | ORAL_TABLET | Freq: Four times a day (QID) | ORAL | 0 refills | Status: AC | PRN

## 2021-10-16 MED ORDER — TAMSULOSIN HCL 0.4 MG PO CAPS
0.4000 mg | ORAL_CAPSULE | Freq: Every day | ORAL | Status: DC
  Administered 2021-10-16: 0.4 mg via ORAL
  Filled 2021-10-16: qty 1

## 2021-10-16 MED ORDER — TAMSULOSIN HCL 0.4 MG PO CAPS: 0.4000 mg | ORAL_CAPSULE | Freq: Every day | ORAL | 0 refills | Status: AC

## 2021-10-16 MED ORDER — LACTATED RINGERS IV BOLUS
500.0000 mL | Freq: Once | INTRAVENOUS | Status: AC
Start: 2021-10-16 — End: 2021-10-16
  Administered 2021-10-16: 500 mL via INTRAVENOUS

## 2021-10-16 MED ORDER — ACETAMINOPHEN 500 MG PO TABS
1000.0000 mg | ORAL_TABLET | Freq: Once | ORAL | Status: AC
  Administered 2021-10-16: 1000 mg via ORAL
  Filled 2021-10-16: qty 2

## 2021-10-16 NOTE — ED Notes (Signed)
RN to bedside to introduce self to pt and initiate pt care. Pt was in bathroom when I entered. Pt then ambulated to bed and was seated.

## 2021-10-16 NOTE — Discharge Instructions (Addendum)
As we discussed your blood sugar and blood pressure are both elevated today.  While the certainly could be from the stress of your kidney stone, I recommend you have both your blood pressure and glucose rechecked by your primary care doctor in the next 7 to 10 days.

## 2021-10-16 NOTE — ED Provider Notes (Signed)
[facility] Provider Note    Event Date/Time   First MD Initiated Contact with Patient 10/16/21 1102     (approximate)   History   Flank Pain and Nausea   HPI  Joseph Clements is a 81 y.o. male with a past medical history of DM, CVA, HDL, anxiety, B12 deficiency, and GERD without any known history of kidney stones presents for evaluation of some sudden onset of left lower back pain rating around the left flank started today.  Patient states he has had some nausea and vomiting as well.  He denies any diarrhea, constipation, burning with urination, blood gross blood in his urine, right-sided abdominal flank or lower back pain, chest pain, cough, shortness of breath, any upper back pain, headache, earache, sore throat rash or extremity pain.  No prior similar episodes.  No clearly feeding aggravating factors.  Denies any other acute concerns at this time.     Physical Exam  Triage Vital Signs: ED Triage Vitals  Enc Vitals Group     BP 10/16/21 1057 (!) 244/136     Pulse Rate 10/16/21 1057 (!) 126     Resp 10/16/21 1057 20     Temp 10/16/21 1057 98.6 F (37 C)     Temp Source 10/16/21 1057 Oral     SpO2 10/16/21 1057 100 %     Weight 10/16/21 1054 165 lb 5.5 oz (75 kg)     Height 10/16/21 1054 5\' 6"  (1.676 m)     Head Circumference --      Peak Flow --      Pain Score --      Pain Loc --      Pain Edu? --      Excl. in [place]? --     Most recent vital signs: Vitals:   10/16/21 1057  BP: (!) 244/136  Pulse: (!) 126  Resp: 20  Temp: 98.6 F (37 C)  SpO2: 100%    General: Awake, appears fairly uncomfortable. CV:  Dry mucous membranes.  2+ radial pulses.  Tachycardia. Resp:  Normal effort.  Abd:  No distention.  Other:  No CVA tenderness or overlying skin lesions over the left flank or CVA region.   ED Results / Procedures / Treatments  Labs (all labs ordered are listed, but only abnormal results are displayed) Labs Reviewed  COMPREHENSIVE  METABOLIC PANEL - Abnormal; Notable for the following components:      Result Value   Glucose, Bld 210 (*)    BUN 26 (*)    Creatinine, Ser 1.44 (*)    GFR, Estimated 49 (*)    All other components within normal limits  URINALYSIS, COMPLETE (UACMP) WITH MICROSCOPIC - Abnormal; Notable for the following components:   Color, Urine STRAW (*)    APPearance CLEAR (*)    Glucose, UA 150 (*)    Hgb urine dipstick LARGE (*)    Protein, ur 30 (*)    RBC / HPF >50 (*)    All other components within normal limits  CBC WITH DIFFERENTIAL/PLATELET  MAGNESIUM     EKG  ECG shows no evidence of acute ischemia or significant arrhythmia.  Normal axis.  QTc interval is 496.   RADIOLOGY  CT abdomen pelvis on my interpretation showed some left-sided hydronephrosis and some edema around the left kidney.  I do not see any clear stone although radiology interpretation there is a 0.3 cm middle third of the ureteral stone with some associated  hydronephrosis.  They also note nonobstructing stones bilaterally and I benign-appearing renal cysts bilaterally as well as diverticulosis without evidence of diverticulitis and some prostamegaly and had hepatic steatosis.  No other acute process noted by radiology.   PROCEDURES:  Critical Care performed: No  .1-3 Lead EKG Interpretation Performed by: Lucrezia Starch, MD Authorized by: Lucrezia Starch, MD     Interpretation: non-specific     ECG rate assessment: tachycardic     Rhythm: sinus rhythm     Ectopy: none     Conduction: normal    The patient is on the cardiac monitor to evaluate for evidence of arrhythmia and/or significant heart rate changes.   MEDICATIONS ORDERED IN ED: Medications  acetaminophen (TYLENOL) tablet 1,000 mg (has no administration in time range)  tamsulosin (FLOMAX) capsule 0.4 mg (has no administration in time range)  morphine (PF) 4 MG/ML injection 4 mg (4 mg Intravenous Given 10/16/21 1149)  ondansetron (ZOFRAN) injection  4 mg (4 mg Intravenous Given 10/16/21 1149)  lactated ringers bolus 500 mL (0 mLs Intravenous Stopped 10/16/21 1242)  morphine (PF) 4 MG/ML injection 4 mg (4 mg Intravenous Given 10/16/21 1251)  ondansetron (ZOFRAN) injection 4 mg (4 mg Intravenous Given 10/16/21 1251)  lactated ringers bolus 500 mL (500 mLs Intravenous New Bag/Given 10/16/21 1242)     IMPRESSION / MDM / ASSESSMENT AND PLAN / ED COURSE  I reviewed the triage vital signs and the nursing notes.                              Differential diagnosis includes, but is not limited to kidney stone, pyelonephritis, AAA, diverticulitis, and cystitis.   ECG shows no evidence of acute ischemia or significant arrhythmia.  Normal axis.  QTc interval is 496.  This is not suggestive of atypical presentation for ACS.  CT abdomen pelvis on my interpretation showed some left-sided hydronephrosis and some edema around the left kidney.  I do not see any clear stone although radiology interpretation there is a 0.3 cm middle third of the ureteral stone with some associated hydronephrosis.  They also note nonobstructing stones bilaterally and I benign-appearing renal cysts bilaterally as well as diverticulosis without evidence of diverticulitis and some prostamegaly and had hepatic steatosis.  No other acute process noted by radiology.  CMP is remarkable for glucose of 210 and a creatinine of 1.44 without any other significant electrolyte or metabolic derangements.  This is compared to creatinine of 1.5 obtained on 07/05/2021.  CBC shows no leukocytosis or acute anemia.  Has some glucose hemoglobin and protein but no evidence of infection.  Suspect patient's tachycardia hypertension and nausea and vomiting as well as pain are all related to acute left-sided kidney stone.  This does not appear infected I do not believe patient is septic.  His kidney function is at baseline.  On arrival patient is quite hypertensive and tachycardic with a blood pressure of  244/136 and a heart rate of 126 on reassessment after analgesia and some fluids and Zofran he is now tolerating p.o. and is systolic in the 211H and a heart rate in the 90s.  Advised him that I still think he should have his blood pressure rechecked by his PCP in the next 7 to 1010 days and his glucose.  He is amenable to plan.  He will otherwise follow-up with urology for his kidney stone.  Rx written for Flomax Zofran and Norco.  Given  he is now tolerating p.o. without difficulty and is feeling much better with proving his blood pressure I think he is stable for discharge with outpatient follow-up.  Discharged in stable condition.  Strict return precautions advised and discussed.        FINAL CLINICAL IMPRESSION(S) / ED DIAGNOSES   Final diagnoses:  Kidney stone  Hypertension, unspecified type  Hyperglycemia     Rx / DC Orders   ED Discharge Orders          Ordered    ondansetron (ZOFRAN ODT) 4 MG disintegrating tablet  Every 4 hours PRN        10/16/21 1242    tamsulosin (FLOMAX) 0.4 MG CAPS capsule  Daily        10/16/21 1242    HYDROcodone-acetaminophen (NORCO) 5-325 MG tablet  Every 6 hours PRN        10/16/21 1259             Note:  This document was prepared using Dragon voice recognition software and may include unintentional dictation errors.   Lucrezia Starch, MD 10/16/21 1302

## 2021-10-16 NOTE — ED Triage Notes (Signed)
Pt reports sudden onset of right flank pain that radiates around and down with some NV.

## 2021-10-31 ENCOUNTER — Other Ambulatory Visit: Payer: Self-pay

## 2021-10-31 ENCOUNTER — Ambulatory Visit
Admission: RE | Admit: 2021-10-31 | Discharge: 2021-10-31 | Disposition: A | Discharge status: Home / Self Care | Payer: Medicare Other | Source: Ambulatory Visit | Attending: Urology | Admitting: Urology

## 2021-10-31 ENCOUNTER — Ambulatory Visit: Payer: Medicare Other | Admitting: Urology

## 2021-10-31 ENCOUNTER — Encounter: Payer: Self-pay | Admitting: Urology

## 2021-10-31 VITALS — BP 186/104 | HR 89 | Ht 67.0 in | Wt 178.0 lb

## 2021-10-31 DIAGNOSIS — N200 Calculus of kidney: Secondary | ICD-10-CM | POA: Diagnosis not present

## 2021-10-31 DIAGNOSIS — N201 Calculus of ureter: Secondary | ICD-10-CM | POA: Diagnosis not present

## 2021-10-31 LAB — URINALYSIS, COMPLETE
Bilirubin, UA: NEGATIVE
Glucose, UA: NEGATIVE
Ketones, UA: NEGATIVE
Leukocytes,UA: NEGATIVE
Nitrite, UA: NEGATIVE
Protein,UA: NEGATIVE
Specific Gravity, UA: 1.025 (ref 1.005–1.030)
Urobilinogen, Ur: 0.2 mg/dL (ref 0.2–1.0)
pH, UA: 5.5 (ref 5.0–7.5)

## 2021-10-31 LAB — MICROSCOPIC EXAMINATION
Bacteria, UA: NONE SEEN
Epithelial Cells (non renal): NONE SEEN /hpf (ref 0–10)
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
## 2021-11-02 ENCOUNTER — Encounter: Payer: Self-pay | Admitting: Urology

## 2021-11-05 ENCOUNTER — Telehealth: Payer: Self-pay | Admitting: Urology

## 2021-11-05 DIAGNOSIS — N132 Hydronephrosis with renal and ureteral calculous obstruction: Secondary | ICD-10-CM

## 2021-11-05 NOTE — Telephone Encounter (Signed)
KUB reviewed and the small left ureteral calculus is not identified.  He most likely has passed the stone.  Order placed for a follow-up renal ultrasound to make sure the kidney blockage has resolved.  Will contact with results. ?

## 2021-11-07 NOTE — Telephone Encounter (Signed)
Notified patient as instructed, patient pleased °

## 2021-12-28 ENCOUNTER — Ambulatory Visit
Admission: RE | Admit: 2021-12-28 | Discharge: 2021-12-28 | Disposition: A | Discharge status: Home / Self Care | Payer: Medicare Other | Source: Ambulatory Visit | Attending: Urology | Admitting: Urology

## 2021-12-28 DIAGNOSIS — N132 Hydronephrosis with renal and ureteral calculous obstruction: Secondary | ICD-10-CM | POA: Insufficient documentation

## 2021-12-30 ENCOUNTER — Telehealth: Payer: Self-pay | Admitting: *Deleted

## 2021-12-30 NOTE — Telephone Encounter (Signed)
-----   Message from Abbie Sons, MD sent at 12/30/2021  7:47 AM EDT ----- ?Renal ultrasound shows resolution of his kidney blockage indicating a passed stone.  Keep regular follow-up as scheduled ?

## 2021-12-30 NOTE — Telephone Encounter (Signed)
Spoke with patient and advised results   

## 2022-08-09 ENCOUNTER — Emergency Department
Admission: EM | Admit: 2022-08-09 | Discharge: 2022-08-09 | Disposition: A | Discharge status: Home / Self Care | Payer: Medicare Other | Attending: Emergency Medicine | Admitting: Emergency Medicine

## 2022-08-09 ENCOUNTER — Encounter: Payer: Self-pay | Admitting: *Deleted

## 2022-08-09 ENCOUNTER — Other Ambulatory Visit: Payer: Self-pay

## 2022-08-09 ENCOUNTER — Emergency Department: Payer: Medicare Other

## 2022-08-09 DIAGNOSIS — Z1152 Encounter for screening for COVID-19: Secondary | ICD-10-CM | POA: Diagnosis not present

## 2022-08-09 DIAGNOSIS — N132 Hydronephrosis with renal and ureteral calculous obstruction: Secondary | ICD-10-CM | POA: Diagnosis not present

## 2022-08-09 DIAGNOSIS — R1032 Left lower quadrant pain: Secondary | ICD-10-CM | POA: Diagnosis present

## 2022-08-09 DIAGNOSIS — R058 Other specified cough: Secondary | ICD-10-CM | POA: Diagnosis not present

## 2022-08-09 DIAGNOSIS — N200 Calculus of kidney: Secondary | ICD-10-CM

## 2022-08-09 LAB — COMPREHENSIVE METABOLIC PANEL
ALT: 36 U/L (ref 0–44)
AST: 28 U/L (ref 15–41)
Albumin: 4.4 g/dL (ref 3.5–5.0)
Alkaline Phosphatase: 98 U/L (ref 38–126)
Anion gap: 12 (ref 5–15)
BUN: 27 mg/dL — ABNORMAL HIGH (ref 8–23)
CO2: 23 mmol/L (ref 22–32)
Calcium: 9.5 mg/dL (ref 8.9–10.3)
Chloride: 101 mmol/L (ref 98–111)
Creatinine, Ser: 1.98 mg/dL — ABNORMAL HIGH (ref 0.61–1.24)
GFR, Estimated: 33 mL/min — ABNORMAL LOW (ref >60)
Glucose, Bld: 225 mg/dL — ABNORMAL HIGH (ref 70–99)
Potassium: 4.6 mmol/L (ref 3.5–5.1)
Sodium: 136 mmol/L (ref 135–145)
Total Bilirubin: 0.9 mg/dL (ref 0.3–1.2)
Total Protein: 8.1 g/dL (ref 6.5–8.1)

## 2022-08-09 LAB — CBC
HCT: 49.2 % (ref 39.0–52.0)
Hemoglobin: 16.2 g/dL (ref 13.0–17.0)
MCH: 28.1 pg (ref 26.0–34.0)
MCHC: 32.9 g/dL (ref 30.0–36.0)
MCV: 85.4 fL (ref 80.0–100.0)
Platelets: 248 10*3/uL (ref 150–400)
RBC: 5.76 MIL/uL (ref 4.22–5.81)
RDW: 12.6 % (ref 11.5–15.5)
WBC: 8.7 10*3/uL (ref 4.0–10.5)
nRBC: 0 % (ref 0.0–0.2)

## 2022-08-09 LAB — LIPASE, BLOOD: Lipase: 31 U/L (ref 11–51)

## 2022-08-09 LAB — URINALYSIS, ROUTINE W REFLEX MICROSCOPIC
Bacteria, UA: NONE SEEN
Bilirubin Urine: NEGATIVE
Glucose, UA: >=500 mg/dL — AB
Ketones, ur: NEGATIVE mg/dL
Leukocytes,Ua: NEGATIVE
Nitrite: NEGATIVE
Protein, ur: 100 mg/dL — AB
Specific Gravity, Urine: 1.01 (ref 1.005–1.030)
Squamous Epithelial / HPF: NONE SEEN (ref 0–5)
pH: 6 (ref 5.0–8.0)

## 2022-08-09 LAB — RESP PANEL BY RT-PCR (RSV, FLU A&B, COVID)  RVPGX2
Influenza A by PCR: NEGATIVE
Influenza B by PCR: NEGATIVE
Resp Syncytial Virus by PCR: NEGATIVE
SARS Coronavirus 2 by RT PCR: NEGATIVE

## 2022-08-09 MED ORDER — SODIUM CHLORIDE 0.9 % IV BOLUS
1000.0000 mL | Freq: Once | INTRAVENOUS | Status: AC
  Administered 2022-08-09: 1000 mL via INTRAVENOUS

## 2022-08-09 MED ORDER — TAMSULOSIN HCL 0.4 MG PO CAPS: 0.4000 mg | ORAL_CAPSULE | Freq: Every day | ORAL | 0 refills | Status: AC

## 2022-08-09 MED ORDER — ONDANSETRON 4 MG PO TBDP: 4.0000 mg | ORAL_TABLET | Freq: Three times a day (TID) | ORAL | 0 refills | Status: DC | PRN

## 2022-08-09 MED ORDER — KETOROLAC TROMETHAMINE 15 MG/ML IJ SOLN
15.0000 mg | Freq: Once | INTRAMUSCULAR | Status: AC
  Administered 2022-08-09: 15 mg via INTRAVENOUS
  Filled 2022-08-09: qty 1

## 2022-08-09 MED ORDER — OXYCODONE-ACETAMINOPHEN 5-325 MG PO TABS: 1.0000 | ORAL_TABLET | ORAL | 0 refills | Status: AC | PRN

## 2022-08-09 MED ORDER — MORPHINE SULFATE (PF) 4 MG/ML IV SOLN
4.0000 mg | Freq: Once | INTRAVENOUS | Status: AC
  Administered 2022-08-09: 4 mg via INTRAVENOUS
  Filled 2022-08-09: qty 1

## 2022-08-09 MED ORDER — OXYCODONE-ACETAMINOPHEN 5-325 MG PO TABS
2.0000 | ORAL_TABLET | Freq: Once | ORAL | Status: AC
  Administered 2022-08-09: 2 via ORAL
  Filled 2022-08-09: qty 2

## 2022-08-09 MED ORDER — MORPHINE SULFATE (PF) 4 MG/ML IV SOLN
6.0000 mg | Freq: Once | INTRAVENOUS | Status: AC
  Administered 2022-08-09: 6 mg via INTRAVENOUS
  Filled 2022-08-09: qty 2

## 2022-08-09 MED ORDER — IOHEXOL 300 MG/ML  SOLN
75.0000 mL | Freq: Once | INTRAMUSCULAR | Status: AC | PRN
  Administered 2022-08-09: 75 mL via INTRAVENOUS

## 2022-08-09 MED ORDER — ONDANSETRON HCL 4 MG/2ML IJ SOLN
4.0000 mg | Freq: Once | INTRAMUSCULAR | Status: AC
  Administered 2022-08-09: 4 mg via INTRAVENOUS
  Filled 2022-08-09: qty 2

## 2022-08-09 NOTE — ED Triage Notes (Addendum)
Pt reports vomiting and back pain for 1 day.  Pt also reports constipation.  Last BM this am.  Pt also has abd pain.  Pt alert  speech clear.   Pt was sent from [facility] for an eval after going to his PMD this morning.

## 2022-08-09 NOTE — ED Provider Notes (Signed)
[facility] Provider Note    Event Date/Time   First MD Initiated Contact with Patient 08/09/22 1652     (approximate)   History   Back Pain, Emesis, and Constipation   HPI  Joseph Clements is a 80 y.o. male presents to the emergency department with hematuria and abdominal pain.  Patient endorses left-sided abdominal pain that has been worsening over the past couple of days.  Does endorse a history of kidney stones in the past.  No prior kidney stone surgery was necessary.  Denies any fever or chills.  Endorses nausea and vomiting.  Denies any dysuria.  Recent constipation.   Physical Exam   Triage Vital Signs: ED Triage Vitals  Enc Vitals Group     BP 08/09/22 1638 (!) 196/109     Pulse Rate 08/09/22 1638 (!) 109     Resp 08/09/22 1638 20     Temp 08/09/22 1638 98 F (36.7 C)     Temp Source 08/09/22 1638 Oral     SpO2 08/09/22 1638 99 %     Weight 08/09/22 1639 182 lb (82.6 kg)     Height 08/09/22 1639 '5\' 5"'$  (1.651 m)     Head Circumference --      Peak Flow --      Pain Score 08/09/22 1639 5     Pain Loc --      Pain Edu? --      Excl. in [place]? --     Most recent vital signs: Vitals:   08/09/22 2100 08/09/22 2130  BP: (!) 160/72 (!) 164/81  Pulse: (!) 105 (!) 116  Resp: 11 17  Temp:  97.9 F (36.6 C)  SpO2: 93% 100%    Physical Exam Constitutional:      General: He is in acute distress.     Appearance: He is well-developed.  HENT:     Head: Atraumatic.  Eyes:     Conjunctiva/sclera: Conjunctivae normal.  Cardiovascular:     Rate and Rhythm: Regular rhythm. Tachycardia present.  Pulmonary:     Effort: No respiratory distress.  Abdominal:     Tenderness: There is left CVA tenderness.  Musculoskeletal:     Cervical back: Normal range of motion.  Skin:    General: Skin is warm.     Capillary Refill: Capillary refill takes 2 to 3 seconds.  Neurological:     Mental Status: He is alert. Mental status is at baseline.      IMPRESSION / MDM / ASSESSMENT AND PLAN / ED COURSE  I reviewed the triage vital signs and the nursing notes.  Differential diagnosis including kidney stones, pyelonephritis, small bowel obstruction, appendicitis, constipation.  Plan for lab work and CT scan with contrast  EKG  I, Nathaniel Man, the attending physician, personally viewed and interpreted this ECG.   Rate: Sinus tachycardia  Rhythm: Normal sinus  Axis: Normal  Intervals: Normal  ST&T Change: None  Sinus tachycardia while on cardiac telemetry.  RADIOLOGY I independently reviewed imaging, my interpretation of imaging: Left-sided hydronephrosis.  Read as 2 mm obstructing kidney stone with mild hydronephrosis.  LABS (all labs ordered are listed, but only abnormal results are displayed) Labs interpreted as -   Hyperglycemia but no signs of DKA.  Creatinine appears to be at his baseline.  No signs of urinary tract infection. Labs Reviewed  COMPREHENSIVE METABOLIC PANEL - Abnormal; Notable for the following components:      Result Value   Glucose, Bld  225 (*)    BUN 27 (*)    Creatinine, Ser 1.98 (*)    GFR, Estimated 33 (*)    All other components within normal limits  URINALYSIS, ROUTINE W REFLEX MICROSCOPIC - Abnormal; Notable for the following components:   Color, Urine STRAW (*)    APPearance CLEAR (*)    Glucose, UA >=500 (*)    Hgb urine dipstick MODERATE (*)    Protein, ur 100 (*)    All other components within normal limits  RESP PANEL BY RT-PCR (RSV, FLU A&B, COVID)  RVPGX2  LIPASE, BLOOD  CBC    TREATMENT   1 L of IV fluids, IV Zofran, IV morphine  On reevaluation continued to have pain given IV morphine redosed and IV Toradol  On reevaluation significant improvement of his pain.  Blood pressure significantly improved with pain control.  No history of hypertension.  Discussed close follow-up with his primary care physician to repeat and recheck his blood pressure.  Do not feel that the  patient needs blood pressure medication at this time.  Given information to follow-up with urology.  Given return precautions.   PROCEDURES:  Critical Care performed: No  Procedures  Patient's presentation is most consistent with acute presentation with potential threat to life or bodily function.   MEDICATIONS ORDERED IN ED: Medications  sodium chloride 0.9 % bolus 1,000 mL (0 mLs Intravenous Stopped 08/09/22 2035)  ondansetron (ZOFRAN) injection 4 mg (4 mg Intravenous Given 08/09/22 1825)  morphine (PF) 4 MG/ML injection 4 mg (4 mg Intravenous Given 08/09/22 1825)  iohexol (OMNIPAQUE) 300 MG/ML solution 75 mL (75 mLs Intravenous Contrast Given 08/09/22 1840)  ketorolac (TORADOL) 15 MG/ML injection 15 mg (15 mg Intravenous Given 08/09/22 2034)  morphine (PF) 4 MG/ML injection 6 mg (6 mg Intravenous Given 08/09/22 2034)  oxyCODONE-acetaminophen (PERCOCET/ROXICET) 5-325 MG per tablet 2 tablet (2 tablets Oral Given 08/09/22 2204)    FINAL CLINICAL IMPRESSION(S) / ED DIAGNOSES   Final diagnoses:  Kidney stone     Rx / DC Orders   ED Discharge Orders          Ordered    oxyCODONE-acetaminophen (PERCOCET) 5-325 MG tablet  Every 4 hours PRN        08/09/22 2159    tamsulosin (FLOMAX) 0.4 MG CAPS capsule  Daily        08/09/22 2159    ondansetron (ZOFRAN-ODT) 4 MG disintegrating tablet  Every 8 hours PRN        08/09/22 2159             Note:  This document was prepared using Dragon voice recognition software and may include unintentional dictation errors.   Nathaniel Man, MD 08/09/22 [phone]

## 2022-08-09 NOTE — Discharge Instructions (Addendum)
You are seen in the emergency department and diagnosed with a kidney stone.  There was also 2 areas of calcifications in your bladder.  It is importantly follow-up with urology team so they can reevaluate your kidney stone and the lesion in your bladder.  Stay hydrated and drink plenty of fluids.  Return to the emergency department if your symptoms worsen.  Your blood pressure was elevated in the emergency department it is importantly follow-up with your primary care physician so they can recheck your blood pressure and make sure that your blood pressure was elevated secondary to pain and that you do not need treatment for elevated blood pressure.  Take 600 mg of ibuprofen every 6 hours for pain control for the next couple of days.  If you continue to have severe pain you can take 1 to 2 tablets of oxycodone.  You were given a prescription for narcotic pain medications.  Take only if in severe pain.  These are very addictive medications.  These medications can make you constipated.  If you need to take more than 1-2 doses, start a stool softner.  If you become constipated, take 1 capfull of MiraLAX, can repeat untill having regular bowel movements.  Keep this medication out of reach of any children.

## 2022-08-09 NOTE — ED Notes (Signed)
Fluids and a snack provided. Patient stated he is pain free.

## 2022-08-09 NOTE — ED Notes (Signed)
First nurse note:  Pt here from [facility] clinic for troubles having a BM, staff also states some flank pain and hypertension, [place] staff unsure if pt has HX of hypertension.

## 2022-08-26 NOTE — Progress Notes (Signed)
Viral symptoms of cough and congestions concerning for covid/flu

## 2022-09-06 ENCOUNTER — Ambulatory Visit
Admission: RE | Admit: 2022-09-06 | Discharge: 2022-09-06 | Disposition: A | Discharge status: Home / Self Care | Payer: Medicare Other | Source: Ambulatory Visit | Attending: Urology | Admitting: Urology

## 2022-09-06 ENCOUNTER — Ambulatory Visit: Payer: Medicare Other | Admitting: Urology

## 2022-09-06 ENCOUNTER — Encounter: Payer: Self-pay | Admitting: Urology

## 2022-09-06 VITALS — BP 175/86 | HR 96 | Ht 67.0 in | Wt 175.0 lb

## 2022-09-06 DIAGNOSIS — N200 Calculus of kidney: Secondary | ICD-10-CM

## 2022-09-06 DIAGNOSIS — N210 Calculus in bladder: Secondary | ICD-10-CM

## 2022-09-06 DIAGNOSIS — N201 Calculus of ureter: Secondary | ICD-10-CM | POA: Insufficient documentation

## 2022-09-08 ENCOUNTER — Encounter: Payer: Self-pay | Admitting: Urology

## 2022-09-08 ENCOUNTER — Telehealth: Payer: Self-pay | Admitting: *Deleted

## 2022-09-08 NOTE — Telephone Encounter (Signed)
Notified patient as instructed, patient pleased. Discussed follow-up appointments, patient agrees  

## 2022-09-08 NOTE — Telephone Encounter (Signed)
-----  Message from Abbie Sons, MD sent at 09/08/2022  7:53 AM EST ----- Ureteral calculus is not visualized on KUB and has most likely passed.  Recommend 68-monthfollow-up office visit with KUB.  If he desires treatment of his left renal calculus schedule follow-up to discuss options
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
## 2022-10-12 ENCOUNTER — Ambulatory Visit: Payer: Medicare Other | Admitting: Dermatology

## 2022-10-26 ENCOUNTER — Ambulatory Visit: Payer: Medicare Other | Admitting: Dermatology

## 2022-10-26 VITALS — BP 188/91

## 2022-10-26 DIAGNOSIS — L570 Actinic keratosis: Secondary | ICD-10-CM | POA: Diagnosis not present

## 2022-10-26 DIAGNOSIS — Z85820 Personal history of malignant melanoma of skin: Secondary | ICD-10-CM

## 2022-10-26 DIAGNOSIS — Z1283 Encounter for screening for malignant neoplasm of skin: Secondary | ICD-10-CM

## 2022-10-26 DIAGNOSIS — L578 Other skin changes due to chronic exposure to nonionizing radiation: Secondary | ICD-10-CM

## 2022-10-26 DIAGNOSIS — L814 Other melanin hyperpigmentation: Secondary | ICD-10-CM

## 2022-10-26 DIAGNOSIS — Z85828 Personal history of other malignant neoplasm of skin: Secondary | ICD-10-CM

## 2022-10-26 DIAGNOSIS — D229 Melanocytic nevi, unspecified: Secondary | ICD-10-CM

## 2022-10-26 DIAGNOSIS — L821 Other seborrheic keratosis: Secondary | ICD-10-CM

## 2022-10-26 DIAGNOSIS — D1721 Benign lipomatous neoplasm of skin and subcutaneous tissue of right arm: Secondary | ICD-10-CM

## 2022-10-26 DIAGNOSIS — Z86018 Personal history of other benign neoplasm: Secondary | ICD-10-CM

## 2022-10-26 NOTE — Progress Notes (Signed)
Follow-Up Visit   Subjective  Joseph Clements is a 82 y.o. male who presents for the following: Annual Exam (History of Melanoma, BCC, Dysplastic nevi - The patient presents for Total-Body Skin Exam (TBSE) for skin cancer screening and mole check.  The patient has spots, moles and lesions to be evaluated, some may be new or changing and the patient has concerns that these could be cancer./).  The following portions of the chart were reviewed this encounter and updated as appropriate:   Tobacco  Allergies  Meds  Problems  Med Hx  Surg Hx  Fam Hx     Review of Systems:  No other skin or systemic complaints except as noted in HPI or Assessment and Plan.  Objective  Well appearing patient in no apparent distress; mood and affect are within normal limits.  A full examination was performed including scalp, head, eyes, ears, nose, lips, neck, chest, axillae, abdomen, back, buttocks, bilateral upper extremities, bilateral lower extremities, hands, feet, fingers, toes, fingernails, and toenails. All findings within normal limits unless otherwise noted below.  Face (4) Erythematous thin papules/macules with gritty scale.   Right post shoulder Rubbery nodule   Assessment & Plan   History of Melanoma - No evidence of recurrence today - No lymphadenopathy - Recommend regular full body skin exams - Recommend daily broad spectrum sunscreen SPF 30+ to sun-exposed areas, reapply every 2 hours as needed.  - Call if any new or changing lesions are noted between office visits  History of Basal Cell Carcinoma of the Skin - No evidence of recurrence today - Recommend regular full body skin exams - Recommend daily broad spectrum sunscreen SPF 30+ to sun-exposed areas, reapply every 2 hours as needed.  - Call if any new or changing lesions are noted between office visits  History of Dysplastic Nevi - No evidence of recurrence today - Recommend regular full body skin exams - Recommend daily  broad spectrum sunscreen SPF 30+ to sun-exposed areas, reapply every 2 hours as needed.  - Call if any new or changing lesions are noted between office visits  Lentigines - Scattered tan macules - Due to sun exposure - Benign-appearing, observe - Recommend daily broad spectrum sunscreen SPF 30+ to sun-exposed areas, reapply every 2 hours as needed. - Call for any changes  Seborrheic Keratoses - Stuck-on, waxy, tan-brown papules and/or plaques  - Benign-appearing - Discussed benign etiology and prognosis. - Observe - Call for any changes  Melanocytic Nevi - Tan-brown and/or pink-flesh-colored symmetric macules and papules - Benign appearing on exam today - Observation - Call clinic for new or changing moles - Recommend daily use of broad spectrum spf 30+ sunscreen to sun-exposed areas.   Hemangiomas - Red papules - Discussed benign nature - Observe - Call for any changes  Actinic Damage - Chronic condition, secondary to cumulative UV/sun exposure - diffuse scaly erythematous macules with underlying dyspigmentation - Recommend daily broad spectrum sunscreen SPF 30+ to sun-exposed areas, reapply every 2 hours as needed.  - Staying in the shade or wearing long sleeves, sun glasses (UVA+UVB protection) and wide brim hats (4-inch brim around the entire circumference of the hat) are also recommended for sun protection.  - Call for new or changing lesions.  Skin cancer screening performed today.  AK (actinic keratosis) (4) Face Destruction of lesion - Face Complexity: simple   Destruction method: cryotherapy   Informed consent: discussed and consent obtained   Timeout:  patient name, date of birth, surgical site,  and procedure verified Lesion destroyed using liquid nitrogen: Yes   Region frozen until ice ball extended beyond lesion: Yes   Outcome: patient tolerated procedure well with no complications   Post-procedure details: wound care instructions given    Lipoma of  right upper extremity Right post shoulder Benign-appearing.  Observation.  Call clinic for new or changing lesions.  Recommend daily use of broad spectrum spf 30+ sunscreen to sun-exposed areas.   Return in about 1 year (around 10/26/2023) for TBSE.  I, Ashok Cordia, CMA, am acting as scribe for Sarina Ser, MD . Documentation: I have reviewed the above documentation for accuracy and completeness, and I agree with the above.  Sarina Ser, MD

## 2022-10-26 NOTE — Patient Instructions (Signed)
 Cryotherapy Aftercare  Wash gently with soap and water everyday.   Apply Vaseline and Band-Aid daily until healed. Due to recent changes in healthcare laws, you may see results of your pathology and/or laboratory studies on MyChart before the doctors have had a chance to review them. We understand that in some cases there may be results that are confusing or concerning to you. Please understand that not all results are received at the same time and often the doctors may need to interpret multiple results in order to provide you with the best plan of care or course of treatment. Therefore, we ask that you please give us 2 business days to thoroughly review all your results before contacting the office for clarification. Should we see a critical lab result, you will be contacted sooner.   If You Need Anything After Your Visit  If you have any questions or concerns for your doctor, please call our main line at [phone] and press option 4 to reach your doctor's medical assistant. If no one answers, please leave a voicemail as directed and we will return your call as soon as possible. Messages left after 4 pm will be answered the following business day.   You may also send us a message via MyChart. We typically respond to MyChart messages within 1-2 business days.  For prescription refills, please ask your pharmacy to contact our office. Our fax number is [phone].  If you have an urgent issue when the clinic is closed that cannot wait until the next business day, you can page your doctor at the number below.    Please note that while we do our best to be available for urgent issues outside of office hours, we are not available 24/7.   If you have an urgent issue and are unable to reach us, you may choose to seek medical care at your doctor's office, retail clinic, urgent care center, or emergency room.  If you have a medical emergency, please immediately call 911 or go to the emergency  department.  Pager Numbers  - Dr. Kowalski: [phone]  - Dr. Moye: [phone]  - Dr. Stewart: [phone]  In the event of inclement weather, please call our main line at [phone] for an update on the status of any delays or closures.  Dermatology Medication Tips: Please keep the boxes that topical medications come in in order to help keep track of the instructions about where and how to use these. Pharmacies typically print the medication instructions only on the boxes and not directly on the medication tubes.   If your medication is too expensive, please contact our office at [phone] option 4 or send us a message through MyChart.   We are unable to tell what your co-pay for medications will be in advance as this is different depending on your insurance coverage. However, we may be able to find a substitute medication at lower cost or fill out paperwork to get insurance to cover a needed medication.   If a prior authorization is required to get your medication covered by your insurance company, please allow us 1-2 business days to complete this process.  Drug prices often vary depending on where the prescription is filled and some pharmacies may offer cheaper prices.  The website www.goodrx.com contains coupons for medications through different pharmacies. The prices here do not account for what the cost may be with help from insurance (it may be cheaper with your insurance), but the website can give you the   price if you did not use any insurance.  - You can print the associated coupon and take it with your prescription to the pharmacy.  - You may also stop by our office during regular business hours and pick up a GoodRx coupon card.  - If you need your prescription sent electronically to a different pharmacy, notify our office through [facility] MyChart or by phone at [phone] option 4.     Si Usted Necesita Algo Despus de Su Visita  Tambin puede enviarnos un  mensaje a travs de MyChart. Por lo general respondemos a los mensajes de MyChart en el transcurso de 1 a 2 das hbiles.  Para renovar recetas, por favor pida a su farmacia que se ponga en contacto con nuestra oficina. Nuestro nmero de fax es el [phone].  Si tiene un asunto urgente cuando la clnica est cerrada y que no puede esperar hasta el siguiente da hbil, puede llamar/localizar a su doctor(a) al nmero que aparece a continuacin.   Por favor, tenga en cuenta que aunque hacemos todo lo posible para estar disponibles para asuntos urgentes fuera del horario de oficina, no estamos disponibles las 24 horas del da, los 7 das de la semana.   Si tiene un problema urgente y no puede comunicarse con nosotros, puede optar por buscar atencin mdica  en el consultorio de su doctor(a), en una clnica privada, en un centro de atencin urgente o en una sala de emergencias.  Si tiene una emergencia mdica, por favor llame inmediatamente al 911 o vaya a la sala de emergencias.  Nmeros de bper  - Dr. Kowalski: [phone]  - Dra. Moye: [phone]  - Dra. Stewart: [phone]  En caso de inclemencias del tiempo, por favor llame a nuestra lnea principal al [phone] para una actualizacin sobre el estado de cualquier retraso o cierre.  Consejos para la medicacin en dermatologa: Por favor, guarde las cajas en las que vienen los medicamentos de uso tpico para ayudarle a seguir las instrucciones sobre dnde y cmo usarlos. Las farmacias generalmente imprimen las instrucciones del medicamento slo en las cajas y no directamente en los tubos del medicamento.   Si su medicamento es muy caro, por favor, pngase en contacto con nuestra oficina llamando al [phone] y presione la opcin 4 o envenos un mensaje a travs de MyChart.   No podemos decirle cul ser su copago por los medicamentos por adelantado ya que esto es diferente dependiendo de la cobertura de su seguro. Sin embargo,  es posible que podamos encontrar un medicamento sustituto a menor costo o llenar un formulario para que el seguro cubra el medicamento que se considera necesario.   Si se requiere una autorizacin previa para que su compaa de seguros cubra su medicamento, por favor permtanos de 1 a 2 das hbiles para completar este proceso.  Los precios de los medicamentos varan con frecuencia dependiendo del lugar de dnde se surte la receta y alguna farmacias pueden ofrecer precios ms baratos.  El sitio web www.goodrx.com tiene cupones para medicamentos de diferentes farmacias. Los precios aqu no tienen en cuenta lo que podra costar con la ayuda del seguro (puede ser ms barato con su seguro), pero el sitio web puede darle el precio si no utiliz ningn seguro.  - Puede imprimir el cupn correspondiente y llevarlo con su receta a la farmacia.  - Tambin puede pasar por nuestra oficina durante el horario de atencin regular y recoger una tarjeta de cupones de GoodRx.  - Si necesita que   su receta se enve electrnicamente a una farmacia diferente, informe a nuestra oficina a travs de MyChart de [facility] o por telfono llamando al [phone] y presione la opcin 4.  

## 2022-11-03 ENCOUNTER — Encounter: Payer: Self-pay | Admitting: Dermatology

## 2023-03-05 ENCOUNTER — Other Ambulatory Visit: Payer: Self-pay | Admitting: *Deleted

## 2023-03-05 DIAGNOSIS — N201 Calculus of ureter: Secondary | ICD-10-CM

## 2023-03-09 ENCOUNTER — Encounter: Payer: Self-pay | Admitting: Urology

## 2023-03-09 ENCOUNTER — Ambulatory Visit
Admission: RE | Admit: 2023-03-09 | Discharge: 2023-03-09 | Disposition: A | Discharge status: Home / Self Care | Payer: Medicare Other | Source: Ambulatory Visit | Attending: Urology | Admitting: Urology

## 2023-03-09 ENCOUNTER — Ambulatory Visit: Payer: Medicare Other | Admitting: Urology

## 2023-03-09 ENCOUNTER — Ambulatory Visit
Admission: RE | Admit: 2023-03-09 | Discharge: 2023-03-09 | Disposition: A | Discharge status: Home / Self Care | Payer: Medicare Other | Attending: Urology | Admitting: Urology

## 2023-03-09 VITALS — BP 175/81 | HR 102 | Ht 67.0 in | Wt 170.0 lb

## 2023-03-09 DIAGNOSIS — N201 Calculus of ureter: Secondary | ICD-10-CM

## 2023-03-09 DIAGNOSIS — N210 Calculus in bladder: Secondary | ICD-10-CM
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
## 2023-03-12 ENCOUNTER — Encounter: Payer: Self-pay | Admitting: Urology

## 2023-09-10 ENCOUNTER — Ambulatory Visit: Payer: Medicare Other | Admitting: Urology

## 2023-09-17 ENCOUNTER — Ambulatory Visit: Payer: Medicare Other | Admitting: Urology

## 2023-09-17 ENCOUNTER — Ambulatory Visit
Admission: RE | Admit: 2023-09-17 | Discharge: 2023-09-17 | Disposition: A | Discharge status: Home / Self Care | Payer: Medicare Other | Attending: Urology | Admitting: Urology

## 2023-09-17 ENCOUNTER — Encounter: Payer: Self-pay | Admitting: Urology

## 2023-09-17 ENCOUNTER — Ambulatory Visit
Admission: RE | Admit: 2023-09-17 | Discharge: 2023-09-17 | Disposition: A | Discharge status: Home / Self Care | Payer: Medicare Other | Source: Ambulatory Visit | Attending: Urology | Admitting: Urology

## 2023-09-17 VITALS — BP 183/85 | HR 96

## 2023-09-17 DIAGNOSIS — Z87442 Personal history of urinary calculi: Secondary | ICD-10-CM

## 2023-09-17 DIAGNOSIS — N201 Calculus of ureter: Secondary | ICD-10-CM | POA: Insufficient documentation

## 2023-09-17 DIAGNOSIS — N210 Calculus in bladder: Secondary | ICD-10-CM

## 2023-09-17 DIAGNOSIS — N200 Calculus of kidney: Secondary | ICD-10-CM | POA: Diagnosis not present
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
## 2023-09-18 ENCOUNTER — Encounter: Payer: Self-pay | Admitting: Urology

## 2023-11-01 ENCOUNTER — Ambulatory Visit: Payer: Medicare Other | Admitting: Dermatology

## 2024-01-01 ENCOUNTER — Encounter: Payer: Self-pay | Admitting: Dermatology

## 2024-01-01 ENCOUNTER — Ambulatory Visit (INDEPENDENT_AMBULATORY_CARE_PROVIDER_SITE_OTHER): Payer: Medicare Other | Admitting: Dermatology

## 2024-01-01 DIAGNOSIS — L820 Inflamed seborrheic keratosis: Secondary | ICD-10-CM

## 2024-01-01 DIAGNOSIS — L578 Other skin changes due to chronic exposure to nonionizing radiation: Secondary | ICD-10-CM | POA: Diagnosis not present

## 2024-01-01 DIAGNOSIS — Z1283 Encounter for screening for malignant neoplasm of skin: Secondary | ICD-10-CM

## 2024-01-01 DIAGNOSIS — D692 Other nonthrombocytopenic purpura: Secondary | ICD-10-CM

## 2024-01-01 DIAGNOSIS — Z85820 Personal history of malignant melanoma of skin: Secondary | ICD-10-CM

## 2024-01-01 DIAGNOSIS — L814 Other melanin hyperpigmentation: Secondary | ICD-10-CM

## 2024-01-01 DIAGNOSIS — D1801 Hemangioma of skin and subcutaneous tissue: Secondary | ICD-10-CM

## 2024-01-01 DIAGNOSIS — W908XXA Exposure to other nonionizing radiation, initial encounter: Secondary | ICD-10-CM

## 2024-01-01 DIAGNOSIS — Z86018 Personal history of other benign neoplasm: Secondary | ICD-10-CM

## 2024-01-01 DIAGNOSIS — L821 Other seborrheic keratosis: Secondary | ICD-10-CM

## 2024-01-01 DIAGNOSIS — D1721 Benign lipomatous neoplasm of skin and subcutaneous tissue of right arm: Secondary | ICD-10-CM

## 2024-01-01 DIAGNOSIS — L570 Actinic keratosis: Secondary | ICD-10-CM

## 2024-01-01 DIAGNOSIS — Z85828 Personal history of other malignant neoplasm of skin: Secondary | ICD-10-CM

## 2024-01-01 NOTE — Patient Instructions (Addendum)

## 2024-01-01 NOTE — Progress Notes (Signed)
 Follow-Up Visit   Subjective  Joseph Clements is a 83 y.o. male who presents for the following: Skin Cancer Screening and Full Body Skin Exam Hx of aks, hx of bcc, hx of lipoma, hx of isks Spots on back, raised spots behind right and left ear   The patient presents for Total-Body Skin Exam (TBSE) for skin cancer screening and mole check. The patient has spots, moles and lesions to be evaluated, some may be new or changing and the patient may have concern these could be cancer.  The following portions of the chart were reviewed this encounter and updated as appropriate: medications, allergies, medical history  Review of Systems:  No other skin or systemic complaints except as noted in HPI or Assessment and Plan.  Objective  Well appearing patient in no apparent distress; mood and affect are within normal limits.  A full examination was performed including scalp, head, eyes, ears, nose, lips, neck, chest, axillae, abdomen, back, buttocks, bilateral upper extremities, bilateral lower extremities, hands, feet, fingers, toes, fingernails, and toenails. All findings within normal limits unless otherwise noted below.   Relevant physical exam findings are noted in the Assessment and Plan.  face and scalp x 15 (15) Erythematous thin papules/macules with gritty scale.  right cheek x 1, b/l arms and hands x 5  , left neck x 10, right neck x 2 (18) Erythematous stuck-on, waxy papule or plaque right lower eyelid margin x 1 Erythematous stuck-on waxy papule   Assessment & Plan   SKIN CANCER SCREENING PERFORMED TODAY.  ACTINIC DAMAGE - Chronic condition, secondary to cumulative UV/sun exposure - diffuse scaly erythematous macules with underlying dyspigmentation - Recommend daily broad spectrum sunscreen SPF 30+ to sun-exposed areas, reapply every 2 hours as needed.  - Staying in the shade or wearing long sleeves, sun glasses (UVA+UVB protection) and wide brim hats (4-inch brim around the  entire circumference of the hat) are also recommended for sun protection.  - Call for new or changing lesions.  LENTIGINES, SEBORRHEIC KERATOSES, HEMANGIOMAS - Benign normal skin lesions - Benign-appearing - Call for any changes  MELANOCYTIC NEVI - Tan-brown and/or pink-flesh-colored symmetric macules and papules - Benign appearing on exam today - Observation - Call clinic for new or changing moles - Recommend daily use of broad spectrum spf 30+ sunscreen to sun-exposed areas.   Purpura - Chronic; persistent and recurrent.  Treatable, but not curable. - Violaceous macules and patches - Benign - Related to trauma, age, sun damage and/or use of blood thinners, chronic use of topical and/or oral steroids - Observe - Can use OTC arnica containing moisturizer such as Dermend Bruise Formula if desired - Call for worsening or other concerns  Lipoma  Exam: Subcutaneous rubbery nodule(s) Location: right posterior shoulder  Benign-appearing. Exam most consistent with an Lipoma. Discussed that a Lipoma is a benign fatty growth that can grow over time and sometimes become painful or otherwise symptomatic. Some patients may have one or several lipomas.. Benign Hereditary Lipomatosis is a hereditary familial condition where family members tend to grow multiple lipomas.  Recommend observation if it is not changing, growing or symptomatic. Recommend surgical excision to remove it if it is painful, growing, symptomatic, or other changes noted. Please contact our office for new or changing lesions so they can be evaluated.  HISTORY OF MELANOMA Right lateral infrascapular - over 31 years ago Treated with lymphoscintigraphy - No evidence of recurrence today - No lymphadenopathy - Recommend regular full body skin exams -  Recommend daily broad spectrum sunscreen SPF 30+ to sun-exposed areas, reapply every 2 hours as needed.  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  Call if any new or changing lesions are noted between office visits ACTINIC KERATOSIS (15) face and scalp x 15 (15) Actinic keratoses are precancerous spots that appear secondary to cumulative UV radiation exposure/sun exposure over time. They are chronic with expected duration over 1 year. A portion of actinic keratoses will progress to squamous cell carcinoma of the skin. It is not possible to reliably predict which spots will progress to skin cancer and so treatment is recommended to prevent development of skin cancer.  Recommend daily broad spectrum sunscreen SPF 30+ to sun-exposed areas, reapply every 2 hours as needed.  Recommend staying in the shade or wearing long sleeves, sun glasses (UVA+UVB protection) and wide brim hats (4-inch brim around the entire circumference of the hat). Call for new or changing lesions. Destruction of lesion - face and scalp x 15 (15) Complexity: simple   Destruction method: cryotherapy   Informed consent: discussed and consent obtained   Timeout:  patient name, date of birth, surgical site, and procedure verified Lesion destroyed using liquid nitrogen: Yes   Region frozen until ice ball extended beyond lesion: Yes   Outcome: patient tolerated procedure well with no complications   Post-procedure details: wound care instructions given    INFLAMED SEBORRHEIC KERATOSIS (19) right cheek x 1, b/l arms and hands x 5  , left neck x 10, right neck x 2 (18), right lower eyelid margin x 1 Symptomatic, irritating, patient would like treated. Destruction of lesion - right cheek x 1, b/l arms and hands x 5  , left neck x 10, right neck x 2 (18), right lower eyelid margin x 1 Complexity: simple   Destruction method: cryotherapy   Informed consent: discussed and consent obtained   Timeout:  patient name, date of birth, surgical site, and procedure verified Lesion destroyed using liquid nitrogen: Yes   Region frozen until ice ball extended beyond lesion: Yes   Outcome: patient tolerated procedure well with no complications   Post-procedure details: wound care instructions given   Return for 6 month ak follow up, 1 year tbse .  IRandee Busing, CMA, am acting as scribe for Celine Collard, MD.   Documentation: I have reviewed the above documentation for accuracy and completeness, and I agree with the above.  Celine Collard, MD

## 2024-05-08 ENCOUNTER — Emergency Department

## 2024-05-08 ENCOUNTER — Other Ambulatory Visit: Payer: Self-pay

## 2024-05-08 ENCOUNTER — Emergency Department
Admission: EM | Admit: 2024-05-08 | Discharge: 2024-05-08 | Disposition: A | Discharge status: Home / Self Care | Attending: Emergency Medicine | Admitting: Emergency Medicine

## 2024-05-08 DIAGNOSIS — N132 Hydronephrosis with renal and ureteral calculous obstruction: Secondary | ICD-10-CM | POA: Diagnosis not present

## 2024-05-08 DIAGNOSIS — N23 Unspecified renal colic: Secondary | ICD-10-CM

## 2024-05-08 DIAGNOSIS — N201 Calculus of ureter: Secondary | ICD-10-CM

## 2024-05-08 DIAGNOSIS — I10 Essential (primary) hypertension: Secondary | ICD-10-CM | POA: Diagnosis not present

## 2024-05-08 DIAGNOSIS — R1030 Lower abdominal pain, unspecified: Secondary | ICD-10-CM | POA: Diagnosis present

## 2024-05-08 LAB — URINALYSIS, ROUTINE W REFLEX MICROSCOPIC
Bacteria, UA: NONE SEEN
Bilirubin Urine: NEGATIVE
Glucose, UA: >=500 mg/dL — AB
Ketones, ur: 5 mg/dL — AB
Leukocytes,Ua: NEGATIVE
Nitrite: NEGATIVE
Protein, ur: 100 mg/dL — AB
RBC / HPF: >50 RBC/hpf (ref 0–5)
Specific Gravity, Urine: 1.015 (ref 1.005–1.030)
pH: 5 (ref 5.0–8.0)

## 2024-05-08 LAB — CBC WITH DIFFERENTIAL/PLATELET
Abs Immature Granulocytes: 0.02 K/uL (ref 0.00–0.07)
Basophils Absolute: 0 K/uL (ref 0.0–0.1)
Basophils Relative: 1 %
Eosinophils Absolute: 0 K/uL (ref 0.0–0.5)
Eosinophils Relative: 0 %
HCT: 48 % (ref 39.0–52.0)
Hemoglobin: 15.7 g/dL (ref 13.0–17.0)
Immature Granulocytes: 0 %
Lymphocytes Relative: 13 %
Lymphs Abs: 1.2 K/uL (ref 0.7–4.0)
MCH: 27.9 pg (ref 26.0–34.0)
MCHC: 32.7 g/dL (ref 30.0–36.0)
MCV: 85.4 fL (ref 80.0–100.0)
Monocytes Absolute: 0.5 K/uL (ref 0.1–1.0)
Monocytes Relative: 6 %
Neutro Abs: 6.9 K/uL (ref 1.7–7.7)
Neutrophils Relative %: 80 %
Platelets: 269 K/uL (ref 150–400)
RBC: 5.62 MIL/uL (ref 4.22–5.81)
RDW: 12.5 % (ref 11.5–15.5)
WBC: 8.6 K/uL (ref 4.0–10.5)
nRBC: 0 % (ref 0.0–0.2)

## 2024-05-08 LAB — BASIC METABOLIC PANEL WITH GFR
Anion gap: 14 (ref 5–15)
BUN: 27 mg/dL — ABNORMAL HIGH (ref 8–23)
CO2: 22 mmol/L (ref 22–32)
Calcium: 9.4 mg/dL (ref 8.9–10.3)
Chloride: 103 mmol/L (ref 98–111)
Creatinine, Ser: 1.52 mg/dL — ABNORMAL HIGH (ref 0.61–1.24)
GFR, Estimated: 45 mL/min — ABNORMAL LOW (ref >60)
Glucose, Bld: 219 mg/dL — ABNORMAL HIGH (ref 70–99)
Potassium: 4.1 mmol/L (ref 3.5–5.1)
Sodium: 139 mmol/L (ref 135–145)

## 2024-05-08 LAB — HEPATIC FUNCTION PANEL
ALT: 24 U/L (ref 0–44)
AST: 18 U/L (ref 15–41)
Albumin: 4.2 g/dL (ref 3.5–5.0)
Alkaline Phosphatase: 112 U/L (ref 38–126)
Bilirubin, Direct: 0.2 mg/dL (ref 0.0–0.2)
Indirect Bilirubin: 0.5 mg/dL (ref 0.3–0.9)
Total Bilirubin: 0.7 mg/dL (ref 0.0–1.2)
Total Protein: 7.9 g/dL (ref 6.5–8.1)

## 2024-05-08 LAB — LIPASE, BLOOD: Lipase: 27 U/L (ref 11–51)

## 2024-05-08 MED ORDER — IOHEXOL 300 MG/ML  SOLN
80.0000 mL | Freq: Once | INTRAMUSCULAR | Status: AC | PRN
  Administered 2024-05-08: 80 mL via INTRAVENOUS

## 2024-05-08 MED ORDER — KETOROLAC TROMETHAMINE 15 MG/ML IJ SOLN
15.0000 mg | Freq: Once | INTRAMUSCULAR | Status: AC
  Administered 2024-05-08: 15 mg via INTRAVENOUS
  Filled 2024-05-08: qty 1

## 2024-05-08 MED ORDER — MORPHINE SULFATE (PF) 4 MG/ML IV SOLN
4.0000 mg | Freq: Once | INTRAVENOUS | Status: AC
  Administered 2024-05-08: 4 mg via INTRAVENOUS
  Filled 2024-05-08: qty 1

## 2024-05-08 MED ORDER — HYDROCODONE-ACETAMINOPHEN 5-325 MG PO TABS: 1.0000 | ORAL_TABLET | Freq: Four times a day (QID) | ORAL | 0 refills | Status: AC | PRN

## 2024-05-08 MED ORDER — ONDANSETRON HCL 4 MG/2ML IJ SOLN
4.0000 mg | Freq: Once | INTRAMUSCULAR | Status: AC
  Administered 2024-05-08: 4 mg via INTRAVENOUS
  Filled 2024-05-08: qty 2

## 2024-05-08 MED ORDER — ONDANSETRON HCL 4 MG PO TABS: 4.0000 mg | ORAL_TABLET | Freq: Four times a day (QID) | ORAL | 0 refills | Status: AC | PRN

## 2024-05-08 MED ORDER — SODIUM CHLORIDE 0.9 % IV BOLUS
1000.0000 mL | Freq: Once | INTRAVENOUS | Status: AC
  Administered 2024-05-08: 1000 mL via INTRAVENOUS

## 2024-05-08 MED ORDER — TAMSULOSIN HCL 0.4 MG PO CAPS: 0.4000 mg | ORAL_CAPSULE | Freq: Every day | ORAL | 0 refills | Status: AC

## 2024-05-08 NOTE — ED Provider Notes (Signed)
 [facility] Provider Note    Event Date/Time   First MD Initiated Contact with Patient 05/08/24 1355     (approximate)   History   Flank Pain (/)   HPI  Joseph Clements is a 83 year old male with history of nephrolithiasis, BPH presenting to the emergency department for evaluation of flank pain.  Last night patient had onset of flank pain with associated nausea and vomiting.  This feels similar to prior episodes of kidney stones.  Does additionally report some lower abdominal pain that started after having a bowel movement earlier today that is not typical for his kidney stones.      Physical Exam   Triage Vital Signs: ED Triage Vitals  Encounter Vitals Group     BP 05/08/24 1240 (!) 220/100     Girls Systolic BP Percentile --      Girls Diastolic BP Percentile --      Boys Systolic BP Percentile --      Boys Diastolic BP Percentile --      Pulse Rate 05/08/24 1240 87     Resp 05/08/24 1240 20     Temp 05/08/24 1240 97.7 F (36.5 C)     Temp Source 05/08/24 1240 Oral     SpO2 05/08/24 1240 100 %     Weight 05/08/24 1241 170 lb (77.1 kg)     Height 05/08/24 1241 5' 6 (1.676 m)     Head Circumference --      Peak Flow --      Pain Score 05/08/24 1241 8     Pain Loc --      Pain Education --      Exclude from Growth Chart --     Most recent vital signs: Vitals:   05/08/24 1240 05/08/24 1530  BP: (!) 220/100 (!) 194/79  Pulse: 87 86  Resp: 20   Temp: 97.7 F (36.5 C)   SpO2: 100% 90%     General: Awake, interactive  CV:  Regular rate, good peripheral perfusion.  Resp:  Unlabored respirations.  Abd:  Nondistended soft, mild tenderness over the lower abdomen and left flank without rebound or guarding,  Neuro:  Symmetric facial movement, fluid speech   ED Results / Procedures / Treatments   Labs (all labs ordered are listed, but only abnormal results are displayed) Labs Reviewed  BASIC METABOLIC PANEL WITH GFR - Abnormal;  Notable for the following components:      Result Value   Glucose, Bld 219 (*)    BUN 27 (*)    Creatinine, Ser 1.52 (*)    GFR, Estimated 45 (*)    All other components within normal limits  URINALYSIS, ROUTINE W REFLEX MICROSCOPIC - Abnormal; Notable for the following components:   Color, Urine YELLOW (*)    APPearance HAZY (*)    Glucose, UA >=500 (*)    Hgb urine dipstick LARGE (*)    Ketones, ur 5 (*)    Protein, ur 100 (*)    All other components within normal limits  CBC WITH DIFFERENTIAL/PLATELET  HEPATIC FUNCTION PANEL  LIPASE, BLOOD     EKG EKG independently reviewed and interpreted by myself demonstrates:    RADIOLOGY Imaging independently reviewed and interpreted by myself demonstrates:  CT abdomen pelvis demonstrates left-sided renal stone measuring 5 mm with associated hydronephrosis, no other acute intra-abdominal processes  Formal Radiology Read:  CT ABDOMEN PELVIS W CONTRAST Result Date: 05/08/2024 CLINICAL DATA:  Acute abdominal pain mainly  in the left flank and lower abdominal region. EXAM: CT ABDOMEN AND PELVIS WITH CONTRAST TECHNIQUE: Multidetector CT imaging of the abdomen and pelvis was performed using the standard protocol following bolus administration of intravenous contrast. RADIATION DOSE REDUCTION: This exam was performed according to the departmental dose-optimization program which includes automated exposure control, adjustment of the mA and/or kV according to patient size and/or use of iterative reconstruction technique. CONTRAST:  80mL OMNIPAQUE  IOHEXOL  300 MG/ML  SOLN COMPARISON:  08/09/2022 FINDINGS: Lower chest: Mild basilar scarring changes. No infiltrates or effusions. No pulmonary lesions. No pericardial effusion. Aortic and coronary artery calcifications appears stable. Hepatobiliary: No hepatic lesions or intrahepatic biliary dilatation. The gallbladder is surgically absent. No common bile duct dilatation. Pancreas: Stable scattered pancreatic  calcifications likely related to prior pancreatitis. No evidence of acute inflammation. No mass or ductal dilatation. Age related pancreatic atrophy. Chronic interstitial changes in the fat adjacent to the pancreatic tail and extending down into the left pericolic gutter likely postinflammatory. Spleen: Normal in size without focal abnormality. Adrenals/Urinary Tract: Stable small right adrenal gland nodules consistent with benign adenomas. Bilateral renal calculi with a lower pole staghorn type calculus the left side. Significant left-sided hydronephrosis and mild left hydroureter down to an obstructing calculus in the mid left ureter measuring 5 mm. Associated decreased perfusion of the left kidney and perinephric fluid consistent with a high-grade obstruction. The calculus is located just above the pelvic inlet at the L5-S1 level. 17 mm calculus also noted in the left side of the bladder which is new since 2023. Also, there is a moderate-sized bladder diverticulum anteriorly on the left side containing a 13 mm calculus. No right-sided ureteral calculi or hydronephrosis. Simple bilateral renal cysts not requiring any further imaging evaluation follow-up. Stomach/Bowel: The stomach, duodenum, small bowel and colon are grossly normal without oral contrast. No inflammatory changes, mass lesions or obstructive findings. The appendix is normal. Stable colonic diverticulosis without findings for acute diverticulitis. Vascular/Lymphatic: Stable atherosclerotic calcifications involving the aorta and branch vessels but no aneurysm or dissection. The major venous structures are patent. No mesenteric or retroperitoneal mass or adenopathy. No pelvic adenopathy. Reproductive: Mild prostate gland enlargement. The seminal vesicles are normal. The right testicle appears to be in the right inguinal canal which is a stable finding. Other: No pelvic mass or adenopathy. No free pelvic fluid collections. No inguinal mass or  adenopathy. No abdominal wall hernia or subcutaneous lesions. Musculoskeletal: No significant bony findings. IMPRESSION: 1. 5 mm obstructing calculus in the mid/distal left ureter with significant left-sided hydronephrosis and mild left hydroureter. Associated decreased perfusion of the left kidney and perinephric fluid consistent with a high-grade obstruction. 2. Bilateral renal calculi with a lower pole staghorn type calculus on the left side. 3. 17 mm calculus in the left side of the bladder which is new since 2023. 4. Moderate-sized bladder diverticulum anteriorly on the left side containing a 13 mm calculus. 5. Stable small right adrenal gland nodules consistent with benign adenomas. 6. Stable atherosclerotic calcifications involving the aorta and branch vessels. 7. Aortic atherosclerosis. Aortic Atherosclerosis (ICD10-I70.0). Electronically Signed   By: MYRTIS Stammer M.D.   On: 05/08/2024 17:20    PROCEDURES:  Critical Care performed: No  Procedures   MEDICATIONS ORDERED IN ED: Medications  ketorolac  (TORADOL ) 15 MG/ML injection 15 mg (has no administration in time range)  morphine  (PF) 4 MG/ML injection 4 mg (has no administration in time range)  ondansetron  (ZOFRAN ) injection 4 mg (has no administration in time range)  ondansetron  (ZOFRAN ) injection 4 mg (4 mg Intravenous Given 05/08/24 1519)  morphine  (PF) 4 MG/ML injection 4 mg (4 mg Intravenous Given 05/08/24 1522)  sodium chloride  0.9 % bolus 1,000 mL (1,000 mLs Intravenous New Bag/Given 05/08/24 1515)  iohexol  (OMNIPAQUE ) 300 MG/ML solution 80 mL (80 mLs Intravenous Contrast Given 05/08/24 1649)     IMPRESSION / MDM / ASSESSMENT AND PLAN / ED COURSE  I reviewed the triage vital signs and the nursing notes.  Differential diagnosis includes, but is not limited to, renal stone, diverticulitis, colitis, other acute intra-abdominal process  Patient's presentation is most consistent with acute presentation with potential threat to life  or bodily function.  83 year old male presenting to the emergency department for evaluation of left flank pain and lower abdominal pain.  Hypertensive on presentation in the setting of acute pain.  Reassuring CBC, BMP with stable renal impairment.  Normal LFTs and lipase.  Clinical history concerning for renal stone, but with lower abdominal pain as well, will obtain CT with IV contrast to further evaluate.  Ordered for symptomatic treatment with IV fluids, morphine , Zofran .  CT here demonstrates 5 mm obstructing stone with associated hydronephrosis.  Radiologist notes concerning for high-grade obstruction given decreased perfusion of the left kidney.  Nonobstructing renal calculi noted.  Labs with stable renal impairment with creatinine 1.52.  Reassuring CBC with normal white blood cell count.  Urinalysis not concerning for infection.  With radiology read for high-grade obstruction, case was reviewed with Dr. Georganne with urology. With stable renal function and no concerns for infected stone, he does report that patient does not need change in management for his renal stone.  Specifically if symptoms are controlled, can be discharged for outpatient follow-up.  Patient reassessed.  Reports some ongoing pain, but is comfortable with discharge home.  He will follow-up with his urologist for further evaluation.  Will DC with prescriptions for Flomax , Norco, and Zofran .  Strict return precautions provided.  Patient discharged in stable condition.     FINAL CLINICAL IMPRESSION(S) / ED DIAGNOSES   Final diagnoses:  Left ureteral stone  Renal colic on left side     Rx / DC Orders   ED Discharge Orders          Ordered    HYDROcodone -acetaminophen  (NORCO/VICODIN) 5-325 MG tablet  Every 6 hours PRN        05/08/24 1803    ondansetron  (ZOFRAN ) 4 MG tablet  Every 6 hours PRN        05/08/24 1803    tamsulosin  (FLOMAX ) 0.4 MG CAPS capsule  Daily        05/08/24 1803             Note:  This  document was prepared using Dragon voice recognition software and may include unintentional dictation errors.   Levander Slate, MD 05/08/24 [phone]

## 2024-05-08 NOTE — ED Triage Notes (Signed)
 Pt reports left flank pain with n/v that started last night with hx of needing kidney stones surgically removed. Pt is also diabetic

## 2024-05-08 NOTE — Discharge Instructions (Addendum)
 You were seen in the emergency department today for evaluation of your flank pain.  Your CT scan did show that you have a kidney stone that I suspect is the likely cause of your pain.  Fortunately your urine did not look infected.  You can take Tylenol and ibuprofen to help with your pain.  If you have breakthrough pain, I have sent a short course of narcotic pain medicine to your pharmacy.  This can make you drowsy, so do not drive or operate machinery when taking this.  In addition, I have sent a nausea medicine as well as a medicine that can help pass your stone called Flomax.  Follow-up with urology in the next 1 to 2 weeks for reevaluation.  Return to the ER for new or worsening symptoms including uncontrolled pain despite pain medication, inability to tolerate food or liquids, fevers, or any other new or concerning symptoms.

## 2024-07-08 ENCOUNTER — Encounter: Payer: Self-pay | Admitting: Dermatology

## 2024-07-08 ENCOUNTER — Ambulatory Visit (INDEPENDENT_AMBULATORY_CARE_PROVIDER_SITE_OTHER): Admitting: Dermatology

## 2024-07-08 DIAGNOSIS — L578 Other skin changes due to chronic exposure to nonionizing radiation: Secondary | ICD-10-CM | POA: Diagnosis not present

## 2024-07-08 DIAGNOSIS — L821 Other seborrheic keratosis: Secondary | ICD-10-CM

## 2024-07-08 DIAGNOSIS — W908XXA Exposure to other nonionizing radiation, initial encounter: Secondary | ICD-10-CM | POA: Diagnosis not present

## 2024-07-08 DIAGNOSIS — L820 Inflamed seborrheic keratosis: Secondary | ICD-10-CM | POA: Diagnosis not present

## 2024-07-08 DIAGNOSIS — L570 Actinic keratosis: Secondary | ICD-10-CM

## 2024-07-08 NOTE — Patient Instructions (Addendum)
 Cryotherapy Aftercare  Wash gently with soap and water everyday.   Apply Vaseline and Band-Aid daily until healed.   All the areas we treated on face, neck, and chest If not resolved in 2 months call for an appointment to recheck areas   Due to recent changes in healthcare laws, you may see results of your pathology and/or laboratory studies on MyChart before the doctors have had a chance to review them. We understand that in some cases there may be results that are confusing or concerning to you. Please understand that not all results are received at the same time and often the doctors may need to interpret multiple results in order to provide you with the best plan of care or course of treatment. Therefore, we ask that you please give us  2 business days to thoroughly review all your results before contacting the office for clarification. Should we see a critical lab result, you will be contacted sooner.   If You Need Anything After Your Visit  If you have any questions or concerns for your doctor, please call our main line at [phone] and press option 4 to reach your doctor's medical assistant. If no one answers, please leave a voicemail as directed and we will return your call as soon as possible. Messages left after 4 pm will be answered the following business day.   You may also send us  a message via MyChart. We typically respond to MyChart messages within 1-2 business days.  For prescription refills, please ask your pharmacy to contact our office. Our fax number is [phone].  If you have an urgent issue when the clinic is closed that cannot wait until the next business day, you can page your doctor at the number below.    Please note that while we do our best to be available for urgent issues outside of office hours, we are not available 24/7.   If you have an urgent issue and are unable to reach us , you may choose to seek medical care at your doctor's office, retail clinic,  urgent care center, or emergency room.  If you have a medical emergency, please immediately call 911 or go to the emergency department.  Pager Numbers  - Dr. Hester: [phone]  - Dr. Jackquline: [phone]  - Dr. Claudene: [phone]   - Dr. Raymund: [phone]  In the event of inclement weather, please call our main line at [phone] for an update on the status of any delays or closures.  Dermatology Medication Tips: Please keep the boxes that topical medications come in in order to help keep track of the instructions about where and how to use these. Pharmacies typically print the medication instructions only on the boxes and not directly on the medication tubes.   If your medication is too expensive, please contact our office at [phone] option 4 or send us  a message through MyChart.   We are unable to tell what your co-pay for medications will be in advance as this is different depending on your insurance coverage. However, we may be able to find a substitute medication at lower cost or fill out paperwork to get insurance to cover a needed medication.   If a prior authorization is required to get your medication covered by your insurance company, please allow us  1-2 business days to complete this process.  Drug prices often vary depending on where the prescription is filled and some pharmacies may offer cheaper prices.  The website www.goodrx.com contains coupons for medications through  different pharmacies. The prices here do not account for what the cost may be with help from insurance (it may be cheaper with your insurance), but the website can give you the price if you did not use any insurance.  - You can print the associated coupon and take it with your prescription to the pharmacy.  - You may also stop by our office during regular business hours and pick up a GoodRx coupon card.  - If you need your prescription sent electronically to a different pharmacy, notify our  office through [facility] or by phone at [phone] option 4.     Si Usted Necesita Algo Despus de Su Visita  Tambin puede enviarnos un mensaje a travs de Clinical Cytogeneticist. Por lo general respondemos a los mensajes de MyChart en el transcurso de 1 a 2 das hbiles.  Para renovar recetas, por favor pida a su farmacia que se ponga en contacto con nuestra oficina. Randi lakes de fax es [place] [phone].  Si tiene un asunto urgente cuando la clnica est cerrada y que no puede esperar hasta el siguiente da hbil, puede llamar/localizar a su doctor(a) al nmero que aparece a continuacin.   Por favor, tenga en cuenta que aunque hacemos todo lo posible para estar disponibles para asuntos urgentes fuera del horario de Mabel, no estamos disponibles las 24 horas del da, los 7 [address]  [address] de la [place].   Si tiene un problema urgente y no puede comunicarse con nosotros, puede optar por buscar atencin mdica  en el consultorio de su doctor(a), en una clnica privada, en un centro de atencin urgente o en una sala de emergencias.  Si tiene engineer, drilling, por favor llame inmediatamente al 911 o vaya a la sala de emergencias.  Nmeros de bper  - Dr. Hester: [phone]  - Dra. Jackquline: [phone]  - Dr. Claudene: [phone]  - Dra. Kitts: [phone]  En caso de inclemencias del [place], por favor llame a nuestra lnea principal al [phone] para una actualizacin sobre el estado de cualquier retraso o cierre.  Consejos para la medicacin en dermatologa: Por favor, guarde las cajas en las que vienen los medicamentos de uso tpico para ayudarle a seguir las instrucciones sobre dnde y cmo usarlos. Las farmacias generalmente imprimen las instrucciones del medicamento slo en las cajas y no directamente en los tubos del [place].   Si su medicamento es muy caro, por favor, pngase en contacto con landry rieger llamando al [phone] y presione la opcin 4 o envenos un  mensaje a travs de Clinical Cytogeneticist.   No podemos decirle cul ser su copago por los medicamentos por adelantado ya que esto es diferente dependiendo de la cobertura de su seguro. Sin embargo, es posible que podamos encontrar un medicamento sustituto a audiological scientist un formulario para que el seguro cubra el medicamento que se considera necesario.   Si se requiere una autorizacin previa para que su compaa de seguros cubra su medicamento, por favor permtanos de 1 a 2 das hbiles para completar este proceso.  Los precios de los medicamentos varan con frecuencia dependiendo del environmental consultant de dnde se surte la receta y alguna farmacias pueden ofrecer precios ms baratos.  El sitio web www.goodrx.com tiene cupones para medicamentos de health and safety inspector. Los precios aqu no tienen en cuenta lo que podra costar con la ayuda del seguro (puede ser ms barato con su seguro), pero el sitio web puede darle el precio si no utiliz tourist information centre manager.  - Puede imprimir el  cupn correspondiente y llevarlo con su receta a la farmacia.  - Tambin puede pasar por nuestra oficina durante el horario de atencin regular y education officer, museum una tarjeta de cupones de GoodRx.  - Si necesita que su receta se enve electrnicamente a una farmacia diferente, informe a nuestra oficina a travs de MyChart de [place] o por telfono llamando al [phone] y presione la opcin 4.

## 2024-07-08 NOTE — Progress Notes (Unsigned)
 Follow-Up Visit   Subjective  Joseph Clements is a 83 y.o. male who presents for the following: AK 96m f/u, face, scalp, check spots base of neck has had a while started groing, check spot L infraocular, has been txted in past but comes back, check spot groin, changed colors The patient has spots, moles and lesions to be evaluated, some may be new or changing and the patient may have concern these could be cancer.  The following portions of the chart were reviewed this encounter and updated as appropriate: medications, allergies, medical history  Review of Systems:  No other skin or systemic complaints except as noted in HPI or Assessment and Plan.  Objective  Well appearing patient in no apparent distress; mood and affect are within normal limits.  A focused examination was performed of the following areas: Face, scalp  Relevant exam findings are noted in the Assessment and Plan.  L infraorbital x 1 Pink scaly macules L lower eyelid x 1, ant neck/chest x 16 (17) Stuck on waxy paps with erythema  Assessment & Plan   SEBORRHEIC KERATOSIS groin - Stuck-on, waxy, tan-brown papules and/or plaques  - Benign-appearing - Discussed benign etiology and prognosis. - Observe - Call for any changes  ACTINIC DAMAGE - chronic, secondary to cumulative UV radiation exposure/sun exposure over time - diffuse scaly erythematous macules with underlying dyspigmentation - Recommend daily broad spectrum sunscreen SPF 30+ to sun-exposed areas, reapply every 2 hours as needed.  - Recommend staying in the shade or wearing long sleeves, sun glasses (UVA+UVB protection) and wide brim hats (4-inch brim around the entire circumference of the hat). - Call for new or changing lesions.   AK (ACTINIC KERATOSIS) L infraorbital x 1 Advised pt if not resolved in 2 months RTC  Actinic keratoses are precancerous spots that appear secondary to cumulative UV radiation exposure/sun exposure over time. They are  chronic with expected duration over 1 year. A portion of actinic keratoses will progress to squamous cell carcinoma of the skin. It is not possible to reliably predict which spots will progress to skin cancer and so treatment is recommended to prevent development of skin cancer.  Recommend daily broad spectrum sunscreen SPF 30+ to sun-exposed areas, reapply every 2 hours as needed.  Recommend staying in the shade or wearing long sleeves, sun glasses (UVA+UVB protection) and wide brim hats (4-inch brim around the entire circumference of the hat). Call for new or changing lesions. Destruction of lesion - L infraorbital x 1 Complexity: simple   Destruction method: cryotherapy   Informed consent: discussed and consent obtained   Timeout:  patient name, date of birth, surgical site, and procedure verified Lesion destroyed using liquid nitrogen: Yes   Region frozen until ice ball extended beyond lesion: Yes   Outcome: patient tolerated procedure well with no complications   Post-procedure details: wound care instructions given    INFLAMED SEBORRHEIC KERATOSIS (17) L lower eyelid x 1, ant neck/chest x 16 (17) Symptomatic, irritating, patient would like treated.  Advised pt if not resolved in 39m RTC Destruction of lesion - L lower eyelid x 1, ant neck/chest x 16 (17) Complexity: simple   Destruction method: cryotherapy   Informed consent: discussed and consent obtained   Timeout:  patient name, date of birth, surgical site, and procedure verified Lesion destroyed using liquid nitrogen: Yes   Region frozen until ice ball extended beyond lesion: Yes   Outcome: patient tolerated procedure well with no complications   Post-procedure details:  wound care instructions given    ACTINIC SKIN DAMAGE   SEBORRHEIC KERATOSIS    Return for 6-41m TBSE, Hx of Melanoma, Hx of BCC, Hx of Dysplastic nevi, Hx of AKs.  I, Grayce Saunas, RMA, am acting as scribe for Alm Rhyme, MD .   Documentation: I  have reviewed the above documentation for accuracy and completeness, and I agree with the above.  Alm Rhyme, MD

## 2024-07-09 ENCOUNTER — Encounter: Payer: Self-pay | Admitting: Dermatology

## 2024-07-27 ENCOUNTER — Emergency Department

## 2024-07-27 ENCOUNTER — Inpatient Hospital Stay
Admission: EM | Admit: 2024-07-27 | Discharge: 2024-08-01 | DRG: 064 | Disposition: A | Discharge status: Rehabilitation Facility | Attending: Student | Admitting: Student

## 2024-07-27 ENCOUNTER — Other Ambulatory Visit: Payer: Self-pay

## 2024-07-27 ENCOUNTER — Inpatient Hospital Stay

## 2024-07-27 DIAGNOSIS — I619 Nontraumatic intracerebral hemorrhage, unspecified: Secondary | ICD-10-CM

## 2024-07-27 DIAGNOSIS — E663 Overweight: Secondary | ICD-10-CM | POA: Insufficient documentation

## 2024-07-27 DIAGNOSIS — I10 Essential (primary) hypertension: Secondary | ICD-10-CM | POA: Diagnosis not present

## 2024-07-27 DIAGNOSIS — I639 Cerebral infarction, unspecified: Secondary | ICD-10-CM | POA: Diagnosis not present

## 2024-07-27 DIAGNOSIS — I6203 Nontraumatic chronic subdural hemorrhage: Secondary | ICD-10-CM | POA: Diagnosis not present

## 2024-07-27 DIAGNOSIS — R531 Weakness: Principal | ICD-10-CM

## 2024-07-27 DIAGNOSIS — R29810 Facial weakness: Secondary | ICD-10-CM

## 2024-07-27 DIAGNOSIS — R471 Dysarthria and anarthria: Secondary | ICD-10-CM

## 2024-07-27 DIAGNOSIS — E876 Hypokalemia: Secondary | ICD-10-CM | POA: Insufficient documentation

## 2024-07-27 DIAGNOSIS — R29704 NIHSS score 4: Secondary | ICD-10-CM

## 2024-07-27 DIAGNOSIS — N1831 Chronic kidney disease, stage 3a: Secondary | ICD-10-CM | POA: Diagnosis not present

## 2024-07-27 LAB — COMPREHENSIVE METABOLIC PANEL WITH GFR
ALT: 49 U/L — ABNORMAL HIGH (ref 0–44)
AST: 34 U/L (ref 15–41)
Albumin: 4.3 g/dL (ref 3.5–5.0)
Alkaline Phosphatase: 120 U/L (ref 38–126)
Anion gap: 16 — ABNORMAL HIGH (ref 5–15)
BUN: 18 mg/dL (ref 8–23)
CO2: 22 mmol/L (ref 22–32)
Calcium: 9.7 mg/dL (ref 8.9–10.3)
Chloride: 103 mmol/L (ref 98–111)
Creatinine, Ser: 1.31 mg/dL — ABNORMAL HIGH (ref 0.61–1.24)
GFR, Estimated: 54 mL/min — ABNORMAL LOW (ref >60)
Glucose, Bld: 162 mg/dL — ABNORMAL HIGH (ref 70–99)
Potassium: 4.3 mmol/L (ref 3.5–5.1)
Sodium: 141 mmol/L (ref 135–145)
Total Bilirubin: 0.7 mg/dL (ref 0.0–1.2)
Total Protein: 7.4 g/dL (ref 6.5–8.1)

## 2024-07-27 LAB — DIFFERENTIAL
Abs Immature Granulocytes: 0.04 K/uL (ref 0.00–0.07)
Basophils Absolute: 0 K/uL (ref 0.0–0.1)
Basophils Relative: 0 %
Eosinophils Absolute: 0 K/uL (ref 0.0–0.5)
Eosinophils Relative: 0 %
Immature Granulocytes: 1 %
Lymphocytes Relative: 21 %
Lymphs Abs: 1.5 K/uL (ref 0.7–4.0)
Monocytes Absolute: 0.6 K/uL (ref 0.1–1.0)
Monocytes Relative: 8 %
Neutro Abs: 4.9 K/uL (ref 1.7–7.7)
Neutrophils Relative %: 70 %

## 2024-07-27 LAB — GLUCOSE, CAPILLARY
Glucose-Capillary: 173 mg/dL — ABNORMAL HIGH (ref 70–99)
Glucose-Capillary: 196 mg/dL — ABNORMAL HIGH (ref 70–99)
Glucose-Capillary: 180 mg/dL — ABNORMAL HIGH (ref 70–99)
Glucose-Capillary: 151 mg/dL — ABNORMAL HIGH (ref 70–99)

## 2024-07-27 LAB — CBC
HCT: 44.5 % (ref 39.0–52.0)
Hemoglobin: 15 g/dL (ref 13.0–17.0)
MCH: 28.6 pg (ref 26.0–34.0)
MCHC: 33.7 g/dL (ref 30.0–36.0)
MCV: 84.9 fL (ref 80.0–100.0)
Platelets: 253 K/uL (ref 150–400)
RBC: 5.24 MIL/uL (ref 4.22–5.81)
RDW: 12.9 % (ref 11.5–15.5)
WBC: 7.1 K/uL (ref 4.0–10.5)
nRBC: 0 % (ref 0.0–0.2)

## 2024-07-27 LAB — PROTIME-INR
INR: 0.9 (ref 0.8–1.2)
Prothrombin Time: 13.2 s (ref 11.4–15.2)

## 2024-07-27 LAB — CBG MONITORING, ED: Glucose-Capillary: 161 mg/dL — ABNORMAL HIGH (ref 70–99)

## 2024-07-27 LAB — APTT: aPTT: 22 s — ABNORMAL LOW (ref 24–36)

## 2024-07-27 MED ORDER — ACETAMINOPHEN 650 MG RE SUPP: 650.0000 mg | RECTAL | Status: DC | PRN

## 2024-07-27 MED ORDER — ROSUVASTATIN CALCIUM 20 MG PO TABS
20.0000 mg | ORAL_TABLET | Freq: Every day | ORAL | Status: DC
  Administered 2024-07-27 – 2024-07-28 (×2): 20 mg via ORAL
  Filled 2024-07-27 (×2): qty 1

## 2024-07-27 MED ORDER — IOHEXOL 350 MG/ML SOLN
75.0000 mL | Freq: Once | INTRAVENOUS | Status: AC | PRN
  Administered 2024-07-27: 75 mL via INTRAVENOUS

## 2024-07-27 MED ORDER — ACETAMINOPHEN 325 MG PO TABS
650.0000 mg | ORAL_TABLET | ORAL | Status: DC | PRN
  Filled 2024-07-27: qty 2

## 2024-07-27 MED ORDER — ASPIRIN 81 MG PO TBEC
81.0000 mg | DELAYED_RELEASE_TABLET | Freq: Every day | ORAL | Status: DC
  Administered 2024-07-27: 81 mg via ORAL
  Filled 2024-07-27: qty 1

## 2024-07-27 MED ORDER — BUPROPION HCL ER (XL) 300 MG PO TB24
300.0000 mg | ORAL_TABLET | Freq: Every day | ORAL | Status: DC
  Administered 2024-07-27 – 2024-08-01 (×6): 300 mg via ORAL
  Filled 2024-07-27: qty 2
  Filled 2024-07-27: qty 1
  Filled 2024-07-27: qty 2
  Filled 2024-07-27: qty 1
  Filled 2024-07-27 (×2): qty 2

## 2024-07-27 MED ORDER — SENNOSIDES-DOCUSATE SODIUM 8.6-50 MG PO TABS: 1.0000 | ORAL_TABLET | Freq: Every evening | ORAL | Status: DC | PRN

## 2024-07-27 MED ORDER — ENOXAPARIN SODIUM 40 MG/0.4ML IJ SOSY
40.0000 mg | PREFILLED_SYRINGE | INTRAMUSCULAR | Status: DC
  Administered 2024-07-27: 40 mg via SUBCUTANEOUS
  Filled 2024-07-27: qty 0.4

## 2024-07-27 MED ORDER — STROKE: EARLY STAGES OF RECOVERY BOOK: Freq: Once | Status: AC

## 2024-07-27 MED ORDER — ESCITALOPRAM OXALATE 10 MG PO TABS: 10.0000 mg | ORAL_TABLET | Freq: Every day | ORAL | Status: DC

## 2024-07-27 MED ORDER — SODIUM CHLORIDE 0.9 % IV SOLN: INTRAVENOUS | Status: DC

## 2024-07-27 MED ORDER — HYDRALAZINE HCL 20 MG/ML IJ SOLN
5.0000 mg | Freq: Four times a day (QID) | INTRAMUSCULAR | Status: DC | PRN
  Administered 2024-07-30: 5 mg via INTRAVENOUS
  Filled 2024-07-27: qty 1

## 2024-07-27 MED ORDER — DOCUSATE SODIUM 100 MG PO CAPS
100.0000 mg | ORAL_CAPSULE | Freq: Two times a day (BID) | ORAL | Status: DC
  Administered 2024-07-27 – 2024-07-30 (×6): 100 mg via ORAL
  Filled 2024-07-27 (×6): qty 1

## 2024-07-27 MED ORDER — ACETAMINOPHEN 160 MG/5ML PO SOLN: 650.0000 mg | ORAL | Status: DC | PRN

## 2024-07-27 MED ORDER — INSULIN ASPART 100 UNIT/ML IJ SOLN
0.0000 [IU] | Freq: Three times a day (TID) | INTRAMUSCULAR | Status: DC
  Administered 2024-07-27 – 2024-07-28 (×4): 2 [IU] via SUBCUTANEOUS
  Administered 2024-07-28: 1 [IU] via SUBCUTANEOUS
  Administered 2024-07-29 – 2024-07-30 (×4): 2 [IU] via SUBCUTANEOUS
  Administered 2024-07-30 (×2): 3 [IU] via SUBCUTANEOUS
  Administered 2024-07-31: 2 [IU] via SUBCUTANEOUS
  Administered 2024-07-31: 1 [IU] via SUBCUTANEOUS
  Administered 2024-07-31: 2 [IU] via SUBCUTANEOUS
  Administered 2024-08-01: 3 [IU] via SUBCUTANEOUS
  Administered 2024-08-01 (×2): 2 [IU] via SUBCUTANEOUS
  Filled 2024-07-27: qty 1
  Filled 2024-07-27: qty 3
  Filled 2024-07-27 (×5): qty 2
  Filled 2024-07-27: qty 1
  Filled 2024-07-27: qty 2
  Filled 2024-07-27: qty 3
  Filled 2024-07-27 (×2): qty 2
  Filled 2024-07-27 (×2): qty 3
  Filled 2024-07-27 (×3): qty 2

## 2024-07-27 MED ORDER — PANTOPRAZOLE SODIUM 40 MG PO TBEC
40.0000 mg | DELAYED_RELEASE_TABLET | Freq: Every day | ORAL | Status: DC
  Administered 2024-07-27 – 2024-08-01 (×6): 40 mg via ORAL
  Filled 2024-07-27 (×6): qty 1

## 2024-07-27 MED ORDER — CLOPIDOGREL BISULFATE 75 MG PO TABS
300.0000 mg | ORAL_TABLET | Freq: Once | ORAL | Status: AC
  Administered 2024-07-27: 300 mg via ORAL
  Filled 2024-07-27: qty 4

## 2024-07-27 MED ORDER — SODIUM CHLORIDE 0.9 % IV SOLN: INTRAVENOUS | Status: AC

## 2024-07-27 MED ORDER — PAROXETINE HCL 20 MG PO TABS: 20.0000 mg | ORAL_TABLET | Freq: Every day | ORAL | Status: DC

## 2024-07-27 MED ORDER — CLOPIDOGREL BISULFATE 75 MG PO TABS
75.0000 mg | ORAL_TABLET | Freq: Every day | ORAL | Status: DC
  Filled 2024-07-27: qty 1

## 2024-07-27 MED ORDER — CLOPIDOGREL BISULFATE 75 MG PO TABS: 75.0000 mg | ORAL_TABLET | Freq: Every day | ORAL | Status: DC

## 2024-07-27 MED ORDER — LORAZEPAM 2 MG/ML IJ SOLN: 0.5000 mg | Freq: Four times a day (QID) | INTRAMUSCULAR | Status: DC | PRN

## 2024-07-27 MED ORDER — LINAGLIPTIN 5 MG PO TABS
5.0000 mg | ORAL_TABLET | Freq: Every day | ORAL | Status: DC
  Administered 2024-07-27 – 2024-08-01 (×6): 5 mg via ORAL
  Filled 2024-07-27 (×6): qty 1

## 2024-07-27 NOTE — ED Notes (Signed)
 Called CCMD to place pt on central monitoring

## 2024-07-27 NOTE — ED Triage Notes (Signed)
 Pt to ED via EMS from home for slurred speech, Right sided weakness and right side facial droop. Last known well 1330 yesterday. Family reports that pt had a fall this morning. No hx of stroke. Hx diabetes.    200/100 CBG 175 HR 88 97 Room air 17 RR

## 2024-07-27 NOTE — ED Provider Notes (Signed)
 [facility] Provider Note    Event Date/Time   First MD Initiated Contact with Patient 07/27/24 0805     (approximate)   History   Aphasia   HPI  Joseph Clements is a 83 year old male with history of diabetes presenting to the ER for evaluation of right-sided weakness and slurred speech.  Per EMS last known well at 1:30 PM yesterday.  Patient tells me that he felt normal yesterday at lunch and dinner, but sometime before bed noticed that his speech was abnormal.  Did not note that he had a facial droop until this morning.  Patient reportedly had a witnessed fall this morning after which patient was noted to have a facial droop and weakness with concerns for stroke.  Patient reports he was walking to the bathroom and lost his balance because his leg was weak.  Unsure if he hit his head.  Denies any injuries.    Physical Exam   Triage Vital Signs: ED Triage Vitals [07/27/24 0802]  Encounter Vitals Group     BP (!) 161/78     Girls Systolic BP Percentile      Girls Diastolic BP Percentile      Boys Systolic BP Percentile      Boys Diastolic BP Percentile      Pulse Rate (!) 112     Resp 20     Temp      Temp src      SpO2 96 %     Weight      Height      Head Circumference      Peak Flow      Pain Score      Pain Loc      Pain Education      Exclude from Growth Chart     Most recent vital signs: Vitals:   07/27/24 0802 07/27/24 0816  BP: (!) 161/78   Pulse: (!) 112   Resp: 20   Temp:  97.7 F (36.5 C)  SpO2: 96%      General: Awake, interactive  Head:  Atraumatic CV:  Good peripheral perfusion Resp:  Unlabored respirations Chest wall: Nontender to palpation Abd:  Nondistended, nontender Neuro:  Keenly aware, correctly answers month and age, able to blink eyes and squeeze hands, normal horizontal extraocular movements, no visual field loss, right facial droop present.  The right arm drift but does not hit the bed.  No drift of the  right lower or left-sided extremities.  No limb ataxia.  Normal sensation.  Mild dysarthria present the patient's speech is understandable.  Patient subjectively reports mild difficulty with speech production but is able to repeat sentences, name items though sometimes slow with this. No inattention or neglect. NIH 5.   ED Results / Procedures / Treatments   Labs (all labs ordered are listed, but only abnormal results are displayed) Labs Reviewed  APTT - Abnormal; Notable for the following components:      Result Value   aPTT 22 (*)    All other components within normal limits  COMPREHENSIVE METABOLIC PANEL WITH GFR - Abnormal; Notable for the following components:   Glucose, Bld 162 (*)    Creatinine, Ser 1.31 (*)    ALT 49 (*)    GFR, Estimated 54 (*)    Anion gap 16 (*)    All other components within normal limits  CBG MONITORING, ED - Abnormal; Notable for the following components:   Glucose-Capillary 161 (*)  All other components within normal limits  PROTIME-INR  CBC  DIFFERENTIAL  CBG MONITORING, ED     EKG EKG independently reviewed and interpreted by myself demonstrates:  EKG demonstrates sinus tachycardia at a rate of 115, PR 161, QRS 112, QTc 496, nonspecific ST changes, no STEMI  RADIOLOGY Imaging independently reviewed and interpreted by myself demonstrates:  CTA head and neck CT C-spine  Formal Radiology Read:  CT ANGIO HEAD NECK W WO CM Result Date: 07/27/2024 EXAM: CTA Head and Neck with Intravenous Contrast. CT Head without Contrast. CLINICAL HISTORY: Neuro deficit, acute, stroke suspected. TECHNIQUE: Axial CTA images of the head and neck performed with intravenous contrast. MIP reconstructed images were created and reviewed. Axial computed tomography images of the head/brain performed without intravenous contrast. Note: Per PQRS, the description of internal carotid artery percent stenosis, including 0 percent or normal exam, is based on [place]  Symptomatic Carotid Endarterectomy Trial (NASCET) criteria. Dose reduction technique was used including one or more of the following: automated exposure control, adjustment of mA and kV according to patient size, and/or iterative reconstruction. CONTRAST: With and without. COMPARISON: MRI of the head dated 05/11/2008. FINDINGS: CT HEAD: BRAIN: Ovoid area of diminished attenuation now present within the left basal ganglia and periventricular white matter, which is concerning for acute/subacute infarct. Age-related volume loss. Mild-to-moderate periventricular white matter disease. No acute intraparenchymal hemorrhage. No mass lesion. No midline shift or extra-axial collection. VENTRICLES: No hydrocephalus. ORBITS: Patient is status post bilateral lens replacement. SINUSES AND MASTOIDS: Mild polypoid mucosal disease within the left maxillary sinus. The remaining paranasal sinuses and mastoid air cells are clear. CTA NECK: COMMON CAROTID ARTERIES: Mild calcific plaque within the carotid bulbs bilaterally, with no significant stenosis. No dissection or occlusion. INTERNAL CAROTID ARTERIES: Calcific plaque along the posterior wall of the proximal left internal carotid artery, with approximately 10% luminal stenosis. The cervical segments of the internal carotid arteries are otherwise unremarkable. No dissection or occlusion. VERTEBRAL ARTERIES: The left vertebral artery is dominant. Both vertebral arteries are normal in caliber throughout their respective courses. No significant stenosis. No dissection or occlusion. CTA HEAD: ANTERIOR CEREBRAL ARTERIES: No significant stenosis. No occlusion. No aneurysm. 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 The M1 segment is mildly irregular in contour but demonstrates no flow-limiting stenosis. There is also mild irregularity of the M1 segment of the right middle cerebral artery. No occlusion. No aneurysm.  POSTERIOR CEREBRAL ARTERIES: Moderate stenosis of the P2 segment of the right posterior cerebral artery. No occlusion. No aneurysm. BASILAR ARTERY: No significant stenosis. No occlusion. No aneurysm. OTHER: Mild-to-moderate calcific plaque present within the aortic arch. There is mild calcific atheromatous disease within the carotid siphons with less than 20% luminal stenosis. There is no evidence of large vessel occlusion or flow-limiting stenosis. SOFT TISSUES: No acute finding. No masses or lymphadenopathy. BONES: No acute osseous abnormality. IMPRESSION: 1. Ovoid area of diminished attenuation within the left basal ganglia and periventricular white matter, concerning for acute/subacute infarct. Recommend correlation with MRI of the brain. 2. 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 No large vessel occlusion or flow-limiting stenosis. Electronically signed by: Evalene Coho MD 07/27/2024 10:35 AM EST RP Workstation: [id number]   CT Cervical Spine Wo Contrast Result Date: 07/27/2024 EXAM: CT CERVICAL SPINE WITHOUT CONTRAST 07/27/2024 09:35:37 AM TECHNIQUE: CT of the cervical spine  was performed without the administration of intravenous contrast. Multiplanar reformatted images are provided for review. Automated exposure control, iterative reconstruction, and/or weight based adjustment of the mA/kV was utilized to reduce the radiation dose to as low as reasonably achievable. COMPARISON: None available. CLINICAL HISTORY: Neck trauma (Age >= 65y) FINDINGS: CERVICAL SPINE: BONES AND ALIGNMENT: There is no evidence of fracture or acute traumatic injury. DEGENERATIVE CHANGES: There are mild degenerative changes throughout the cervical spine. SOFT TISSUES: No prevertebral soft tissue swelling. IMPRESSION: 1. No evidence of fracture or acute traumatic injury. 2. Mild degenerative changes  throughout the cervical spine. Electronically signed by: Evalene Coho MD 07/27/2024 10:26 AM EST RP Workstation: [id number]    PROCEDURES:  Critical Care performed: No  Procedures   MEDICATIONS ORDERED IN ED: Medications  iohexol  (OMNIPAQUE ) 350 MG/ML injection 75 mL (75 mLs Intravenous Contrast Given 07/27/24 0935)     IMPRESSION / MDM / ASSESSMENT AND PLAN / ED COURSE  I reviewed the triage vital signs and the nursing notes.  Differential diagnosis includes, but is not limited to, CVA, TIA, intracranial bleed, no evidence of thoracoabdominal trauma, electrolyte abnormality, anemia, hypertensive emergency  Patient's presentation is most consistent with acute presentation with potential threat to life or bodily function.  83 year old male presenting to the emergency department for evaluation of facial droop, right-sided weakness, speech changes.  His presentation is concerning for acute stroke, but he does present outside the thrombolytic window.  He has subtle weakness with what appears to be more dysarthria though he may have a component of subtle expressive aphasia as well.  No appreciable receptive aphasia.  Overall low suspicion for large vessel occlusion, but will review case with neurology.  Clinical Course as of 07/27/24 1143  Sun Jul 27, 2024  0813 Case discussed with Dr. Michaela with neurology. With relatively mild symptoms outside thrombolytic window, he does not recommend code stroke activation.  Does recommend a CT angio head and neck to further evaluate. [NR]  1056 CTA concerning for acute to subacute stroke but without evidence of LVO. [NR]  1132 Patient reassessed.  Agreeable with plan for admission.  Will reach out to hospitalist team. [NR]  1141 Case discussed with Dr. Laurita with hospitalist team.  He will evaluate for anticipated admission. [NR]    Clinical Course User Index [NR] Levander Slate, MD     FINAL CLINICAL IMPRESSION(S) / ED DIAGNOSES   Final  diagnoses:  Right sided weakness  Facial droop  Dysarthria     Rx / DC Orders   ED Discharge Orders     None        Note:  This document was prepared using Dragon voice recognition software and may include unintentional dictation errors.   Levander Slate, MD 07/27/24 [phone]

## 2024-07-27 NOTE — ED Notes (Signed)
 Advised nurse that patient has ready bed

## 2024-07-27 NOTE — Progress Notes (Signed)
 SLP Cancellation Note  Patient Details Name: SALMAAN PATCHIN MRN: [id number] DOB: 27-Jul-1941   Cancelled treatment:       Reason Eval/Treat Not Completed:  (chart reviewed)  Pt has only been admitted 4 hours and is awaiting further neurological workup.  ST services will f/u tomorrow w/ assessment.    Comer Portugal, MS, CCC-SLP Speech Language Pathologist Rehab Services; [facility] [phone] (ascom) Sahra Converse 07/27/2024, 12:58 PM

## 2024-07-27 NOTE — ED Notes (Signed)
 This RN changed pt due to incontinent episode of urine. New linens, gown and brief applied.
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
## 2024-07-27 NOTE — ED Notes (Signed)
 Pt transported to CT ?

## 2024-07-27 NOTE — ED Notes (Signed)
Fall bundle in place

## 2024-07-27 NOTE — Progress Notes (Signed)
 PT Cancellation Note  Patient Details Name: Joseph Clements MRN: [id number] DOB: 10-19-1940   Cancelled Treatment:    Reason Eval/Treat Not Completed: Patient not medically ready. Patient awaiting further neuro workup. MRI pending.     Jomaira Darr 07/27/2024, 12:36 PM

## 2024-07-27 NOTE — ED Notes (Signed)
Ray, MD at bedside.  

## 2024-07-28 ENCOUNTER — Inpatient Hospital Stay: Admit: 2024-07-28 | Discharge: 2024-07-28 | Disposition: A | Attending: Internal Medicine

## 2024-07-28 ENCOUNTER — Telehealth: Payer: Self-pay | Admitting: Neurosurgery

## 2024-07-28 ENCOUNTER — Inpatient Hospital Stay

## 2024-07-28 DIAGNOSIS — R471 Dysarthria and anarthria: Secondary | ICD-10-CM

## 2024-07-28 DIAGNOSIS — I619 Nontraumatic intracerebral hemorrhage, unspecified: Secondary | ICD-10-CM

## 2024-07-28 DIAGNOSIS — I6389 Other cerebral infarction: Secondary | ICD-10-CM | POA: Diagnosis not present

## 2024-07-28 DIAGNOSIS — I6203 Nontraumatic chronic subdural hemorrhage: Secondary | ICD-10-CM | POA: Diagnosis not present

## 2024-07-28 DIAGNOSIS — R531 Weakness: Principal | ICD-10-CM

## 2024-07-28 DIAGNOSIS — E663 Overweight: Secondary | ICD-10-CM | POA: Insufficient documentation

## 2024-07-28 DIAGNOSIS — I10 Essential (primary) hypertension: Secondary | ICD-10-CM | POA: Insufficient documentation

## 2024-07-28 DIAGNOSIS — E876 Hypokalemia: Secondary | ICD-10-CM | POA: Insufficient documentation

## 2024-07-28 DIAGNOSIS — I6381 Other cerebral infarction due to occlusion or stenosis of small artery: Secondary | ICD-10-CM

## 2024-07-28 DIAGNOSIS — R29810 Facial weakness: Secondary | ICD-10-CM

## 2024-07-28 DIAGNOSIS — N1831 Chronic kidney disease, stage 3a: Secondary | ICD-10-CM

## 2024-07-28 LAB — BASIC METABOLIC PANEL WITH GFR
Anion gap: 11 (ref 5–15)
BUN: 24 mg/dL — ABNORMAL HIGH (ref 8–23)
CO2: 24 mmol/L (ref 22–32)
Calcium: 8.8 mg/dL — ABNORMAL LOW (ref 8.9–10.3)
Chloride: 106 mmol/L (ref 98–111)
Creatinine, Ser: 1.4 mg/dL — ABNORMAL HIGH (ref 0.61–1.24)
GFR, Estimated: 50 mL/min — ABNORMAL LOW (ref >60)
Glucose, Bld: 146 mg/dL — ABNORMAL HIGH (ref 70–99)
Potassium: 3.4 mmol/L — ABNORMAL LOW (ref 3.5–5.1)
Sodium: 141 mmol/L (ref 135–145)

## 2024-07-28 LAB — ECHOCARDIOGRAM COMPLETE
AR max vel: 3.12 cm2
AV Area VTI: 3.42 cm2
AV Area mean vel: 3.08 cm2
AV Mean grad: 2.5 mmHg
AV Peak grad: 4.1 mmHg
Ao pk vel: 1.02 m/s
Area-P 1/2: 7.29 cm2
Height: 67 in
MV VTI: 2.27 cm2
S' Lateral: 2.8 cm
Weight: 2880 [oz_av]

## 2024-07-28 LAB — LIPID PANEL
Cholesterol: 142 mg/dL (ref 0–200)
HDL: 38 mg/dL — ABNORMAL LOW (ref >40)
LDL Cholesterol: 78 mg/dL (ref 0–99)
Total CHOL/HDL Ratio: 3.7 ratio
Triglycerides: 130 mg/dL (ref <150)
VLDL: 26 mg/dL (ref 0–40)

## 2024-07-28 LAB — GLUCOSE, CAPILLARY
Glucose-Capillary: 155 mg/dL — ABNORMAL HIGH (ref 70–99)
Glucose-Capillary: 164 mg/dL — ABNORMAL HIGH (ref 70–99)
Glucose-Capillary: 144 mg/dL — ABNORMAL HIGH (ref 70–99)
Glucose-Capillary: 201 mg/dL — ABNORMAL HIGH (ref 70–99)

## 2024-07-28 LAB — HEMOGLOBIN A1C
Hgb A1c MFr Bld: 8.4 % — ABNORMAL HIGH (ref 4.8–5.6)
Mean Plasma Glucose: 194 mg/dL

## 2024-07-28 MED ORDER — ATORVASTATIN CALCIUM 20 MG PO TABS
80.0000 mg | ORAL_TABLET | Freq: Every day | ORAL | Status: DC
  Administered 2024-07-29 – 2024-07-30 (×2): 80 mg via ORAL
  Filled 2024-07-28 (×2): qty 4

## 2024-07-28 MED ORDER — POTASSIUM CHLORIDE CRYS ER 20 MEQ PO TBCR
40.0000 meq | EXTENDED_RELEASE_TABLET | Freq: Once | ORAL | Status: AC
  Administered 2024-07-28: 40 meq via ORAL
  Filled 2024-07-28: qty 2

## 2024-07-28 NOTE — Hospital Course (Signed)
 Joseph Clements is a 83 y.o. male with medical history significant of HTN, DM, anxiety/depression, BPH, presented with slurred speech. 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 Patient is seen by neurosurgery, okay to start dual antiplatelet treatment.

## 2024-07-28 NOTE — Plan of Care (Signed)
  Problem: Nutritional: Goal: Maintenance of adequate nutrition will improve Outcome: Progressing   Problem: Skin Integrity: Goal: Risk for impaired skin integrity will decrease Outcome: Progressing   Problem: Tissue Perfusion: Goal: Adequacy of tissue perfusion will improve Outcome: Progressing   Problem: Coping: Goal: Will identify appropriate support needs Outcome: Progressing

## 2024-07-28 NOTE — Progress Notes (Signed)
 Occupational Therapy Treatment Patient Details Name: Joseph Clements MRN: [id number] DOB: 10-Mar-1941 Today's Date: 07/28/2024   History of present illness Joseph Clements is a 83 y.o. male with medical history significant of HTN, DM, anxiety/depression, BPH, presented with slurred speech. 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   OT comments  Pt seen for OT tx this afternoon. Pt instructed in [facility] activities, foam block for FMC, PLB, and compensatory strategies for drinking and communicating with staff/family to minimize frustrations. Educated in importance of rest and decreasing stimulation to support quality sleep for recovery. Pt verbalized understanding. 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 Decreased drooling on the R side of his mouth as well. Will continue to benefit from high intensity skilled OT services.        If plan is discharge home, recommend the following:  A little help with walking and/or transfers;A little help with bathing/dressing/bathroom;Assistance with cooking/housework;Assist for transportation;Help with stairs or ramp for entrance;Assistance with feeding;Direct supervision/assist for medications management;Supervision due to cognitive status;Direct supervision/assist for financial management   Equipment Recommendations  Other (comment) (defer)    Recommendations for Other Services Rehab consult    Precautions / Restrictions Precautions Precautions: Fall Restrictions Weight Bearing Restrictions Per Provider Order: No       Mobility Bed Mobility Overal bed mobility: Needs Assistance Bed Mobility: Supine to Sit     Supine to sit: Mod assist, Used rails, HOB elevated     General bed mobility comments: mod assist to scoot out to edge of bed. Min A to bring R LE off bed.    Transfers Overall transfer  level: Needs assistance Equipment used: Rolling walker (2 wheels) Transfers: Sit to/from Stand Sit to Stand: Min assist           General transfer comment: increased time, effort     Balance Overall balance assessment: History of Falls, Needs assistance                                         ADL either performed or assessed with clinical judgement   ADL                                              Extremity/Trunk Assessment              Vision       Perception     Praxis     Communication Communication Communication: Impaired Factors Affecting Communication: Reduced clarity of speech;Difficulty expressing self   Cognition Arousal: Alert Behavior During Therapy: Lability Cognition: Cognition impaired     Awareness: Online awareness impaired     Executive functioning impairment (select all impairments): Reasoning, Problem solving OT - Cognition Comments: slow processing                 Following commands: Impaired Following commands impaired: Follows one step commands with increased time, Follows multi-step commands with increased time      Cueing   Cueing Techniques: Verbal cues  Exercises Other Exercises Other Exercises: Pt edu in compensatory strategies for self feeding and RUE use during functional tasks to support recovery. Other Exercises: Pt instructed  in [facility] activities, foam block for FMC, PLB, and compensatory strategies for drinking and communicating with staff/family to minimize frustrations.    Shoulder Instructions       General Comments      Pertinent Vitals/ Pain       Pain Assessment Pain Assessment: No/denies pain  Home Living                                          Prior Functioning/Environment              Frequency  Min 3X/week        Progress Toward Goals  OT Goals(current goals can now be found in the care plan section)  Progress towards OT  goals: Progressing toward goals  Acute Rehab OT Goals Patient Stated Goal: get better OT Goal Formulation: With patient Time For Goal Achievement: 08/11/24 Potential to Achieve Goals: Good  Plan      Co-evaluation                 AM-PAC OT 6 Clicks Daily Activity     Outcome Measure   Help from another person eating meals?: A Little Help from another person taking care of personal grooming?: A Little Help from another person toileting, which includes using toliet, bedpan, or urinal?: A Little Help from another person bathing (including washing, rinsing, drying)?: A Little Help from another person to put on and taking off regular upper body clothing?: A Little Help from another person to put on and taking off regular lower body clothing?: A Little 6 Click Score: 18    End of Session    OT Visit Diagnosis: Hemiplegia and hemiparesis;Other symptoms and signs involving cognitive function;Feeding difficulties (R63.3);Cognitive communication deficit (R41.841) Symptoms and signs involving cognitive functions: Cerebral infarction Hemiplegia - Right/Left: Right Hemiplegia - dominant/non-dominant: Dominant Hemiplegia - caused by: Cerebral infarction   Activity Tolerance Patient tolerated treatment well   Patient Left in bed;with call bell/phone within reach;with bed alarm set   Nurse Communication          Time: 8393-8373 OT Time Calculation (min): 20 min  Charges: OT General Charges $OT Visit: 1 Visit OT Treatments $Therapeutic Activity: 8-22 mins  Warren SAUNDERS., MPH, MS, OTR/L ascom [phone] 07/28/24, 4:49 PM

## 2024-07-28 NOTE — Progress Notes (Signed)

## 2024-07-28 NOTE — Telephone Encounter (Signed)
 non urgetn clinic f/u with head CT okay for brooke or danielle. 4-6 weeks.  Per Dr.Smith.

## 2024-07-28 NOTE — TOC Initial Note (Signed)
 Transition of Care [facility]) - Initial/Assessment Note    Patient Details  Name: Joseph Clements MRN: [id number] Date of Birth: Aug 31, 1940  [facility]) CM/SW Contact:    Daved JONETTA Hamilton, RN Phone Number: 07/28/2024, 3:10 PM  Clinical Narrative:                  Met with patient, introduced self and explained role. Patient verbalized prior to hospital encounter he is independent at home including driving. Discussed with patient therapy recommendations for inpatient rehab and presented [place], [place], and [facility]. Patient verbalized he is in agreement with [facility] and requests to be somewhere close to his home in [place], [place]. Discussed with patient that [facility] is in [place] and that I was informed his wife works in [place], patient verbalized this is true however, he wants to go to the location that is near the [place] on [address]. This CM advised patient I would check if there was an inpatient rehab facility there and follow up with him and his spouse. Patient verbalized agreement with this plan.  This CM attempted to reach patient spouse Aqib Lough, lvmm to return my call  At the time of this documentation, this CM reviewed note from [facility] that patient is a potential candidate and order for rehab consult has been placed.   Expected Discharge Plan: IP Rehab Facility Barriers to Discharge: Continued Medical Work up   Patient Goals and CMS Choice     Choice offered to / list presented to : Patient      Expected Discharge Plan and Services   Discharge Planning Services: CM Consult Post Acute Care Choice: IP Rehab (Patient requesting to go somewhere close to his home, lives in [place], [place]) Living arrangements for the past 2 months: Single Family Home                                      Prior Living Arrangements/Services Living arrangements for the past 2 months: Single Family Home Lives with:: Spouse Patient language and need for interpreter reviewed:: Yes Do  you feel safe going back to the place where you live?: Yes      Need for Family Participation in Patient Care: Yes (Comment) Care giver support system in place?: Yes (comment)   Criminal Activity/Legal Involvement Pertinent to Current Situation/Hospitalization: No - Comment as needed  Activities of Daily Living   ADL Screening (condition at time of admission) Independently performs ADLs?: Yes (appropriate for developmental age) Is the patient deaf or have difficulty hearing?: No Does the patient have difficulty seeing, even when wearing glasses/contacts?: No Does the patient have difficulty concentrating, remembering, or making decisions?: No  Permission Sought/Granted Permission sought to share information with : Case Manager, Magazine Features Editor, Family Supports Permission granted to share information with : Yes, Verbal Permission Granted  Share Information with NAME: Robey Massmann  Permission granted to share info w AGENCY: Facilities  Permission granted to share info w Relationship: spouse  Permission granted to share info w Contact Information: [phone]  Emotional Assessment Appearance:: Appears stated age Attitude/Demeanor/Rapport: Engaged Affect (typically observed): Appropriate Orientation: : Oriented to Self, Oriented to Place, Oriented to Situation Alcohol / Substance Use: Not Applicable Psych Involvement: No (comment)  Admission diagnosis:  Facial droop [R29.810] Stroke (cerebrum) (HCC) [I63.9] Dysarthria [R47.1] Right sided weakness [R53.1] Patient Active Problem List   Diagnosis Date Noted   Overweight (BMI 25.0-29.9)  07/28/2024   Essential hypertension 07/28/2024   CKD stage 3a, GFR 45-59 ml/min (HCC) 07/28/2024   Hypokalemia 07/28/2024   Subdural hematoma, chronic (HCC) 07/28/2024   Right sided weakness 07/28/2024   Dysarthria 07/28/2024   Facial droop 07/28/2024   Hemorrhagic stroke (HCC) 07/28/2024   Stroke (cerebrum) (HCC) 07/27/2024    Acute cholecystitis 05/24/2018   Right ureteral stone 10/25/2015   Gross hematuria 10/15/2015   Kidney stones 10/15/2015   BPH with obstruction/lower urinary tract symptoms 10/15/2015   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   PCP:  Glover Lenis, MD Pharmacy:   [facility] [phone], [facility] - [address] [place] [address] Phone: [phone] Fax: [phone]     Social Drivers of Health (SDOH) Social History: SDOH Screenings   Food Insecurity: Patient Declined (07/27/2024)  Housing: Patient Declined (07/27/2024)  Transportation Needs: Patient Declined (07/27/2024)  Utilities: Patient Declined (07/27/2024)  Financial Resource Strain: Medium Risk (08/08/2023)   Received from [facility]  Social Connections: Unknown (07/27/2024)  Tobacco Use: Medium Risk (07/27/2024)   SDOH Interventions:     Readmission Risk Interventions     No data to display
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
## 2024-07-28 NOTE — Evaluation (Signed)
 Occupational Therapy Evaluation Patient Details Name: Joseph Clements MRN: [id number] DOB: 1941/04/14 Today's Date: 07/28/2024   History of Present Illness   Joseph Clements is a 83 y.o. male with medical history significant of HTN, DM, anxiety/depression, BPH, presented with slurred speech. 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     Clinical Impressions Pt was seen for OT evaluation this date and co-tx with PT. Prior to hospital admission, pt was mod indep with ADL and mobility, using rollator sometimes. Pt lives with his spouse who works some during the day. Pt presents with deficits in R side strength and coordination, sequencing and problem solving during self feeding, and impaired balance, affecting safe and optimal ADL completion. Pt currently requires grossly MIN A for seated UB ADL including self feeding (cues for compensatory strategies), and MIN-MOD A for LB ADL tasks. MIN A for ADL transfers with RW and CGA for in-room mobility. Pt demo's difficulty getting his words out, visibly a bit frustrated. Pt eager to improve to functional baseline. Pt would benefit from skilled OT services to address noted impairments and functional limitations (see below for any additional details) in order to maximize safety and independence while minimizing future risk of falls, injury, and readmission. Anticipate the need for high intensity follow up OT services upon acute hospital DC, >3hrs/day.    If plan is discharge home, recommend the following:   A little help with walking and/or transfers;A little help with bathing/dressing/bathroom;Assistance with cooking/housework;Assist for transportation;Help with stairs or ramp for entrance;Assistance with feeding;Direct supervision/assist for medications management;Supervision due to cognitive status;Direct supervision/assist for financial management     Functional Status Assessment   Patient has  had a recent decline in their functional status and demonstrates the ability to make significant improvements in function in a reasonable and predictable amount of time.     Equipment Recommendations   Other (comment) (defer to next venue pending progress)     Recommendations for Other Services   Rehab consult     Precautions/Restrictions   Precautions Precautions: Fall Recall of Precautions/Restrictions: Impaired Restrictions Weight Bearing Restrictions Per Provider Order: No     Mobility Bed Mobility Overal bed mobility: Needs Assistance Bed Mobility: Supine to Sit     Supine to sit: Mod assist, Used rails, HOB elevated     General bed mobility comments: mod assist to scoot out to edge of bed. Min A to bring R LE off bed.    Transfers Overall transfer level: Needs assistance Equipment used: Rolling walker (2 wheels) Transfers: Sit to/from Stand Sit to Stand: Min assist           General transfer comment: increased time, effort      Balance Overall balance assessment: History of Falls, Needs assistance                                         ADL either performed or assessed with clinical judgement   ADL Overall ADL's : Needs assistance/impaired Eating/Feeding: Supervision/ safety;Cueing for compensatory techinques;Cueing for safety;Minimal assistance Eating/Feeding Details (indicate cue type and reason): Pt attempts to use R hand to help load the spoon held in L hand otherwise uses L non-dom hand to do majority of self feeding (pt later reports that he has been using LUE as dominant since injury to RUE ~2.5 yrs ago), moderate spillage, over stuffs  his mouth prior to swallowing bite requiring VC. Appears to pocket food and has difficulty clearing food from cheeks requiring VC. Drools/spills out of the R side of his mouth. Cues for clearing throat. Some coughing noted. SLP and MD notified. Grooming: Sitting;Set up;Minimal  assistance Grooming Details (indicate cue type and reason): MIN A for thoroughness with wiping nose, mouth         Upper Body Dressing : Sitting;Minimal assistance;Moderate assistance   Lower Body Dressing: Sitting/lateral leans;Moderate assistance;Minimal assistance Lower Body Dressing Details (indicate cue type and reason): don/doff socks seated EOB, more difficult on R side Toilet Transfer: Contact guard assist;Minimal assistance;BSC/3in1;Rolling walker (2 wheels);Ambulation Toilet Transfer Details (indicate cue type and reason): simulated         Functional mobility during ADLs: Contact guard assist;Rolling walker (2 wheels)       Vision         Perception         Praxis         Pertinent Vitals/Pain Pain Assessment Pain Assessment: No/denies pain     Extremity/Trunk Assessment Upper Extremity Assessment Upper Extremity Assessment: Right hand dominant;RUE deficits/detail;Left hand dominant RUE Deficits / Details: hx of injury to R forearm ~2 1/2 years ago limiting functional use of RUE/R hand since that time (pt noted to use LUE as dominant for self feeding) RUE Sensation: WNL RUE Coordination: decreased fine motor;decreased gross motor   Lower Extremity Assessment Lower Extremity Assessment: Defer to PT evaluation;LLE deficits/detail RLE Coordination: decreased gross motor;decreased fine motor   Cervical / Trunk Assessment Cervical / Trunk Assessment: Normal   Communication Communication Communication: Impaired Factors Affecting Communication: Reduced clarity of speech;Difficulty expressing self   Cognition Arousal: Alert Behavior During Therapy: Lability Cognition: Cognition impaired     Awareness: Online awareness impaired     Executive functioning impairment (select all impairments): Reasoning, Problem solving                   Following commands: Impaired Following commands impaired: Follows one step commands with increased time      Cueing  General Comments   Cueing Techniques: Verbal cues      Exercises Other Exercises Other Exercises: Pt edu in compensatory strategies for self feeding and RUE use during functional tasks to support recovery.   Shoulder Instructions      Home Living Family/patient expects to be discharged to:: Private residence Living Arrangements: Spouse/significant other Available Help at Discharge: Family;Available PRN/intermittently Type of Home: House Home Access: Stairs to enter                     Home Equipment: Rollator (4 wheels)   Additional Comments: unsure, no family present patient with difficulty speaking  Lives With: Spouse    Prior Functioning/Environment Prior Level of Function : Independent/Modified Independent;Driving             Mobility Comments: endorses ~3 falls/6mo, uses rollator sometimes in and outside the home      OT Problem List: Decreased strength;Decreased coordination;Decreased cognition;Impaired balance (sitting and/or standing);Decreased knowledge of use of DME or AE;Impaired UE functional use;Decreased knowledge of precautions   OT Treatment/Interventions: Self-care/ADL training;Therapeutic exercise;Therapeutic activities;Neuromuscular education;Cognitive remediation/compensation;DME and/or AE instruction;Patient/family education;Balance training      OT Goals(Current goals can be found in the care plan section)   Acute Rehab OT Goals Patient Stated Goal: get better OT Goal Formulation: With patient Time For Goal Achievement: 08/11/24 Potential to Achieve Goals: Good (excellent) ADL Goals Pt Will  Perform Upper Body Dressing: sitting;with modified independence Pt Will Perform Lower Body Dressing: sit to/from stand;with supervision;with set-up Pt Will Transfer to Toilet: ambulating;with supervision (LRAD) Pt Will Perform Toileting - Clothing Manipulation and hygiene: sit to/from stand;with supervision Additional ADL Goal #1: Pt  will be mod indep with self feeding utilizing learned compensatory strategies to minimize risk of aspiration/spillage, 4/4 opportunities.   OT Frequency:  Min 2X/week    Co-evaluation PT/OT/SLP Co-Evaluation/Treatment: Yes Reason for Co-Treatment: For patient/therapist safety;Necessary to address cognition/behavior during functional activity;To address functional/ADL transfers PT goals addressed during session: Mobility/safety with mobility;Balance;Proper use of DME        AM-PAC OT 6 Clicks Daily Activity     Outcome Measure Help from another person eating meals?: A Little Help from another person taking care of personal grooming?: A Little Help from another person toileting, which includes using toliet, bedpan, or urinal?: A Little Help from another person bathing (including washing, rinsing, drying)?: A Little Help from another person to put on and taking off regular upper body clothing?: A Little Help from another person to put on and taking off regular lower body clothing?: A Little 6 Click Score: 18   End of Session Equipment Utilized During Treatment: Rolling walker (2 wheels) Nurse Communication: Mobility status;Other (comment) (ask pt about chaplain services (lost daughter ~1 wk ago))  Activity Tolerance: Patient tolerated treatment well Patient left: in chair;with call bell/phone within reach;with chair alarm set;with nursing/sitter in room  OT Visit Diagnosis: Hemiplegia and hemiparesis;Other symptoms and signs involving cognitive function;Feeding difficulties (R63.3);Cognitive communication deficit (R41.841) Symptoms and signs involving cognitive functions: Cerebral infarction Hemiplegia - Right/Left: Right Hemiplegia - dominant/non-dominant: Dominant Hemiplegia - caused by: Cerebral infarction                Time: 9149-9061 OT Time Calculation (min): 48 min Charges:  OT General Charges $OT Visit: 1 Visit OT Evaluation $OT Eval Moderate Complexity: 1 Mod OT  Treatments $Self Care/Home Management : 8-22 mins  Warren SAUNDERS., MPH, MS, OTR/L ascom [phone] 07/28/24, 10:06 AM

## 2024-07-28 NOTE — Progress Notes (Signed)
 NEUROLOGY CONSULT FOLLOW UP NOTE   Date of service: July 28, 2024 Patient Name: Joseph Clements MRN:  [id number] DOB:  1941/08/15  Interval Hx/subjective   - NAEON - Neurosurgery consulted for bilateral subdural fluid collections, recommended no acute intervention and f/u in outpatient clinic - On my examination today patient is more dysarthric and has some new mild word-finding difficulties compared to yesterday's exam  Vitals   Vitals:   07/27/24 2334 07/28/24 0400 07/28/24 0800 07/28/24 1150  BP: (!) 152/66 (!) 177/83 (!) 182/92 (!) 173/78  Pulse: 87 88 93 96  Resp:  17 14 18   Temp: (!) 97.5 F (36.4 C) 98.1 F (36.7 C) 97.8 F (36.6 C) 97.8 F (36.6 C)  TempSrc: Oral Oral Oral Oral  SpO2: 96% 99% 98% 97%  Weight:      Height:         Body mass index is 28.19 kg/m.  Physical Exam   Constitutional: Appears well-developed and well-nourished.   Neurologic Examination      Neuro: Mental Status: Patient is awake, alert, oriented to person, place, month, year, and situation. Patient is able to give a clear and coherent history. No signs of aphasia or neglect Speech: severe dysarthria, worse than yesterday, often unintelligible. Some mild word-finding difficulty, new from yesterday Cranial Nerves: II: Visual Fields are full. Pupils are equal, round, and reactive to light.   III,IV, VI: EOMI without ptosis or diploplia.  V: Facial sensation is symmetric to temperature VII: Facial movement with right facial weakness VIII: hearing is intact to voice X: Uvula elevates symmetrically XII: tongue is midline without atrophy or fasciculations.  Motor: Tone is normal. Bulk is normal. 5/5 strength was present on the left, he has 4/5 strength in the right arm and 4+/5 strength in the right leg Sensory: Sensation is symmetric to light touch n the arms and legs. Cerebellar: FNF consistent with weakness on the right, also likely some underlying intentional tremor       Medications  Current Facility-Administered Medications:    acetaminophen  (TYLENOL ) tablet 650 mg, 650 mg, Oral, Q4H PRN **OR** acetaminophen  (TYLENOL ) 160 MG/5ML solution 650 mg, 650 mg, Per Tube, Q4H PRN **OR** acetaminophen  (TYLENOL ) suppository 650 mg, 650 mg, Rectal, Q4H PRN, Laurita Manor T, MD   [START ON 07/29/2024] atorvastatin (LIPITOR) tablet 80 mg, 80 mg, Oral, Daily, Matthews Barnacle M, MD   buPROPion  (WELLBUTRIN  XL) 24 hr tablet 300 mg, 300 mg, Oral, Daily, Zhang, Ping T, MD, 300 mg at 07/28/24 0940   docusate sodium  (COLACE) capsule 100 mg, 100 mg, Oral, BID, Zhang, Ping T, MD, 100 mg at 07/28/24 0940   hydrALAZINE (APRESOLINE) injection 5 mg, 5 mg, Intravenous, Q6H PRN, Laurita Manor T, MD   insulin  aspart (novoLOG ) injection 0-9 Units, 0-9 Units, Subcutaneous, TID WC, Laurita Manor T, MD, 2 Units at 07/28/24 1323   linagliptin (TRADJENTA) tablet 5 mg, 5 mg, Oral, Daily, Laurita, Ping T, MD, 5 mg at 07/28/24 0941   LORazepam (ATIVAN) injection 0.5 mg, 0.5 mg, Intravenous, Q6H PRN, Zhang, Ping T, MD   pantoprazole (PROTONIX) EC tablet 40 mg, 40 mg, Oral, Daily, Laurita, Ping T, MD, 40 mg at 07/28/24 0940   senna-docusate (Senokot-S) tablet 1 tablet, 1 tablet, Oral, QHS PRN, Laurita Manor DASEN, MD  Labs and Diagnostic Imaging   CBC:  Recent Labs  Lab 07/27/24 0810  WBC 7.1  NEUTROABS 4.9  HGB 15.0  HCT 44.5  MCV 84.9  PLT 253    Basic Metabolic  Panel:  Lab Results  Component Value Date   NA 141 07/28/2024   K 3.4 (L) 07/28/2024   CO2 24 07/28/2024   GLUCOSE 146 (H) 07/28/2024   BUN 24 (H) 07/28/2024   CREATININE 1.40 (H) 07/28/2024   CALCIUM  8.8 (L) 07/28/2024   GFRNONAA 50 (L) 07/28/2024   GFRAA 59 (L) 05/28/2018   Lipid Panel:  Lab Results  Component Value Date   LDLCALC 78 07/28/2024   HgbA1c:  Lab Results  Component Value Date   HGBA1C 8.4 (H) 07/27/2024   Urine Drug Screen: No results found for: LABOPIA, COCAINSCRNUR, LABBENZ, AMPHETMU, THCU, LABBARB   Alcohol Level No results found for: [facility] INR  Lab Results  Component Value Date   INR 0.9 07/27/2024   APTT  Lab Results  Component Value Date   APTT 22 (L) 07/27/2024   AED levels: No results found for: PHENYTOIN, ZONISAMIDE, LAMOTRIGINE, LEVETIRACETA  All CNS imaging personally reviewed  CTA H&N 1. Ovoid area of diminished attenuation within the left basal ganglia and periventricular white matter, concerning for acute/subacute infarct. Recommend correlation with MRI of the brain. 2. 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 No large vessel occlusion or flow-limiting stenosis.   MRI brain wo contrast 1. 2.5 cm acute to early subacute ischemic nonhemorrhagic left basal ganglia infarct. 2. Small symmetric bilateral subdural collections overlying the cerebral convexities bilaterally, measuring up to 3 mm in maximal diameter without mass effect. Upon review of prior CT. These appear minimally dense in nature, and likely reflect small subacute subdural hematomas, although mildly complex hygromas could also have this appearance. 3. Underlying age-related cerebral atrophy with mild chronic small vessel ischemic disease.  TTE - no intracardiac clot or other significant abnl, neg bubble  Stroke Labs     Component Value Date/Time   CHOL 142 07/28/2024 0602   TRIG 130 07/28/2024 0602   HDL 38 (L) 07/28/2024 0602   CHOLHDL 3.7 07/28/2024 0602   VLDL 26 07/28/2024 0602   LDLCALC 78 07/28/2024 0602    Lab Results  Component Value Date/Time   HGBA1C 8.4 (H) 07/27/2024 12:54 PM  .   Assessment   A/P: 83 y.o. 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 Etiology of stroke favored to be small vessel disease. He was also noted to have bilateral subdural fluid collections. Neurosurgery was  consulted for the bilateral subdural fluid collections and recommended no acute intervention at this time, follow up in neurosurgery clinic outpatient. On my examination today he his dysarthria is significantly worse than that documented by Dr. Michaela in his initial neurology consult note yesterday. In addition he has some new mild word-finding difficulty. I ordered STAT head CT this evening which showed no findings to explain the worsening exam. I ordered repeat MRI brain to be done overnight to evaluate for any areas of new ischemia and a routine EEG for tomorrow.  Recommendations   - Repeat MRI brain wo contrast overnight to evaluate for new ischemia - rEEG tomorrow - ASA 81mg  daily + plavix  75mg  daily x21 days f/b ASA 81mg  daily monotherapy after that - D/w Dr. Penne Sharps, neurosurgery OK with DAPT in the setting of his bilateral subdural fluid collections - D/c crestor  and start atorvastatin  80mg  daily (LDL is slightly above goal at 78) - PT/OT/SLP - Patient currently being considered for CIR -  Tele - Risk factor modification esp glycemic control - Ambulatory referral to neurology for outpatient f/u at hospital discharge  Will continue to follow ______________________________________________________________________   Signed, Elida CHRISTELLA Ross, MD [facility]
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
## 2024-07-28 NOTE — Progress Notes (Signed)
*  PRELIMINARY RESULTS* Echocardiogram 2D Echocardiogram has been performed.  Floydene Harder 07/28/2024, 8:51 AM

## 2024-07-28 NOTE — Progress Notes (Incomplete)
 F/u [facility] [address]  83 y.o. male right with a sizable subacute stroke on the left.  He will need admission for secondary risk factor modification as well as therapy.   CTA H&N 1. Ovoid area of diminished attenuation within the left basal ganglia and periventricular white matter, concerning for acute/subacute infarct. Recommend correlation with MRI of the brain. 2. 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 No large vessel occlusion or flow-limiting stenosis.  MRI brain wo contrast 1. 2.5 cm acute to early subacute ischemic nonhemorrhagic left basal ganglia infarct. 2. Small symmetric bilateral subdural collections overlying the cerebral convexities bilaterally, measuring up to 3 mm in maximal diameter without mass effect. Upon review of prior CT. These appear minimally dense in nature, and likely reflect small subacute subdural hematomas, although mildly complex hygromas could also have this appearance. 3. Underlying age-related cerebral atrophy with mild chronic small vessel ischemic disease.  TTE - pending   Stroke Labs     Component Value Date/Time   CHOL 142 07/28/2024 0602   TRIG 130 07/28/2024 0602   HDL 38 (L) 07/28/2024 0602   CHOLHDL 3.7 07/28/2024 0602   VLDL 26 07/28/2024 0602   LDLCALC 78 07/28/2024 0602    Lab Results  Component Value Date/Time   HGBA1C 8.4 (H) 07/27/2024 12:54 PM    A/P: 83 y.o. 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 Etiology of stroke favored to be small vessel disease. He was also noted to have bilateral subdural fluid collections. Etiology of stroke favored to be small vessel disease. His stroke workup is complete with the exception of pending TTE.  Recommendations: - F/u TTE - ASA 81mg  daily + plavix  75mg  daily x21 days f/b ASA 81mg  daily monotherapy after  that - Neurosurgery was consulted for the bilateral subdural fluid collections and recommended no acute intervention at this time, follow up in neurosurgery clinic outpatient - D/w Dr. Penne Sharps, neurosurgery OK with DAPT in the setting of his bilateral subdural fluid collections - D/c crestor  and start atorvastatin  80mg  daily (LDL is slightly above goal at 78) - PT/OT/SLP - Tele - Risk factor modification esp glycemic constrol
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
## 2024-07-28 NOTE — Progress Notes (Signed)
  Progress Note   Patient: Joseph Clements FMW:[id number] DOB: [birth year] DOA: 07/27/2024     1 DOS: the patient was seen and examined on 07/28/2024   Brief hospital course: ISAIS KLIPFEL is a 83 y.o. male with medical history significant of HTN, DM, anxiety/depression, BPH, presented with slurred speech. 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 Patient is seen by neurosurgery, okay to start dual antiplatelet treatment.   Principal Problem:   Stroke (cerebrum) (HCC) Active Problems:   Overweight (BMI 25.0-29.9)   Essential hypertension   CKD stage 3a, GFR 45-59 ml/min (HCC)   Hypokalemia   Subdural hematoma, chronic (HCC)   Right sided weakness   Dysarthria   Facial droop   Hemorrhagic stroke (HCC)   Assessment and Plan: Left basal ganglia and ischemic stroke. Small subdural hematoma. Appreciate neurology and neurosurgery consult, continue statin and dual antiplatelet treatment.  Patient has been seen by PT/OT, still has sickening weakness, recommended acute rehab.  Chronic kidney disease stage IIIa. Hypokalemia. Replete potassium.  Essential hypertension. Hold off blood pressure medicine for new stroke.     Subjective:  Patient still has some right arm weakness, but improving.  Still has difficulty with words.  Physical Exam: Vitals:   07/27/24 2334 07/28/24 0400 07/28/24 0800 07/28/24 1150  BP: (!) 152/66 (!) 177/83 (!) 182/92 (!) 173/78  Pulse: 87 88 93 96  Resp:  17 14 18   Temp: (!) 97.5 F (36.4 C) 98.1 F (36.7 C) 97.8 F (36.6 C) 97.8 F (36.6 C)  TempSrc: Oral Oral Oral Oral  SpO2: 96% 99% 98% 97%  Weight:      Height:       General exam: Appears calm and comfortable  Respiratory system: Clear to auscultation. Respiratory effort normal. Cardiovascular system: S1 & S2 heard, RRR. No JVD, murmurs, rubs, gallops or clicks. No pedal edema. Gastrointestinal system: Abdomen is nondistended, soft and  nontender. No organomegaly or masses felt. Normal bowel sounds heard. Central nervous system: Alert and oriented.  Speech is slurred. Extremities: Symmetric 5 x 5 power. Skin: No rashes, lesions or ulcers Psychiatry: Judgement and insight appear normal. Mood & affect appropriate.    Data Reviewed:  Reviewed MRI, CT and CT angiogram, lab results.  Family Communication: None  Disposition: Status is: Inpatient Remains inpatient appropriate because: Severity of disease, pending acute rehab placement     Time spent: 35 minutes  Author: Murvin Mana, MD 07/28/2024 1:13 PM  For on call review [email or web address].

## 2024-07-29 ENCOUNTER — Inpatient Hospital Stay

## 2024-07-29 DIAGNOSIS — I10 Essential (primary) hypertension: Secondary | ICD-10-CM

## 2024-07-29 DIAGNOSIS — R569 Unspecified convulsions: Secondary | ICD-10-CM

## 2024-07-29 DIAGNOSIS — R4182 Altered mental status, unspecified: Secondary | ICD-10-CM

## 2024-07-29 LAB — BASIC METABOLIC PANEL WITH GFR
Anion gap: 10 (ref 5–15)
BUN: 24 mg/dL — ABNORMAL HIGH (ref 8–23)
CO2: 24 mmol/L (ref 22–32)
Calcium: 9.1 mg/dL (ref 8.9–10.3)
Chloride: 107 mmol/L (ref 98–111)
Creatinine, Ser: 1.36 mg/dL — ABNORMAL HIGH (ref 0.61–1.24)
GFR, Estimated: 51 mL/min — ABNORMAL LOW (ref >60)
Glucose, Bld: 167 mg/dL — ABNORMAL HIGH (ref 70–99)
Potassium: 4.1 mmol/L (ref 3.5–5.1)
Sodium: 142 mmol/L (ref 135–145)

## 2024-07-29 LAB — GLUCOSE, CAPILLARY
Glucose-Capillary: 169 mg/dL — ABNORMAL HIGH (ref 70–99)
Glucose-Capillary: 157 mg/dL — ABNORMAL HIGH (ref 70–99)
Glucose-Capillary: 172 mg/dL — ABNORMAL HIGH (ref 70–99)
Glucose-Capillary: 233 mg/dL — ABNORMAL HIGH (ref 70–99)

## 2024-07-29 LAB — MAGNESIUM: Magnesium: 2.2 mg/dL (ref 1.7–2.4)

## 2024-07-29 MED ORDER — SENNOSIDES-DOCUSATE SODIUM 8.6-50 MG PO TABS
2.0000 | ORAL_TABLET | Freq: Two times a day (BID) | ORAL | Status: DC
  Administered 2024-07-29 – 2024-08-01 (×7): 2 via ORAL
  Filled 2024-07-29 (×7): qty 2

## 2024-07-29 MED ORDER — ASPIRIN 81 MG PO TBEC
81.0000 mg | DELAYED_RELEASE_TABLET | Freq: Every day | ORAL | Status: DC
  Administered 2024-07-29 – 2024-08-01 (×4): 81 mg via ORAL
  Filled 2024-07-29 (×4): qty 1

## 2024-07-29 MED ORDER — CLOPIDOGREL BISULFATE 75 MG PO TABS
75.0000 mg | ORAL_TABLET | Freq: Every day | ORAL | Status: DC
  Administered 2024-07-29 – 2024-07-30 (×2): 75 mg via ORAL
  Filled 2024-07-29 (×2): qty 1

## 2024-07-29 NOTE — Progress Notes (Signed)
  Chaplain On-Call attempted a visit with the patient at 1130; however, he was out of the room for a procedure and tests.  Chaplain assured Staff of availability, and will refer the Spiritual Consult to the Afternoon Chaplain.  Chaplain Bebe Ardean EMERSON Hershal., [facility]

## 2024-07-29 NOTE — Plan of Care (Signed)
  Problem: Fluid Volume: Goal: Ability to maintain a balanced intake and output will improve Outcome: Progressing   Problem: Nutritional: Goal: Maintenance of adequate nutrition will improve Outcome: Progressing   Problem: Skin Integrity: Goal: Risk for impaired skin integrity will decrease Outcome: Progressing   Problem: Tissue Perfusion: Goal: Adequacy of tissue perfusion will improve Outcome: Progressing   Problem: Education: Goal: Knowledge of patient specific risk factors will improve (DELETE if not current risk factor) Outcome: Progressing   Problem: Self-Care: Goal: Ability to participate in self-care as condition permits will improve Outcome: Progressing   Problem: Education: Goal: Knowledge of General Education information will improve Description: Including pain rating scale, medication(s)/side effects and non-pharmacologic comfort measures Outcome: Progressing   Problem: Clinical Measurements: Goal: Diagnostic test results will improve Outcome: Progressing

## 2024-07-29 NOTE — Progress Notes (Signed)
 Occupational Therapy Treatment Patient Details Name: Joseph Clements MRN: [id number] DOB: Jun 23, 1941 Today's Date: 07/29/2024   History of present illness [name] is a 83 y.o. male with medical history significant of HTN, DM, anxiety/depression, BPH, presented with slurred speech. 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   OT comments  Pt seen for OT tx. Pt eager to participate. Pt's speech improving, more clear today. Pt required MIN A overall for mobility with RW and tolerated standing at the sink for >76min while completing grooming tasks with CGA for standing, UE support on the counter for stability, and intermittent CGA for grooming tasks. Pt required intermittent cues for sequencing/initiation during session. Overall tolerated well and current recommendation remains appropriate.       If plan is discharge home, recommend the following:  A little help with walking and/or transfers;A little help with bathing/dressing/bathroom;Assistance with cooking/housework;Assist for transportation;Help with stairs or ramp for entrance;Assistance with feeding;Direct supervision/assist for medications management;Supervision due to cognitive status;Direct supervision/assist for financial management   Equipment Recommendations  Other (comment) (defer)    Recommendations for Other Services Rehab consult    Precautions / Restrictions Precautions Precautions: Fall Recall of Precautions/Restrictions: Impaired Restrictions Weight Bearing Restrictions Per Provider Order: No       Mobility Bed Mobility Overal bed mobility: Needs Assistance Bed Mobility: Supine to Sit, Sit to Supine     Supine to sit: Min assist, HOB elevated, Used rails Sit to supine: Min assist   General bed mobility comments: significant time/effort to complete to the R side, cues for bed rail    Transfers Overall transfer level: Needs assistance Equipment used:  Rolling walker (2 wheels) Transfers: Sit to/from Stand Sit to Stand: Min assist           General transfer comment: VC for hand placement     Balance Overall balance assessment: History of Falls, Needs assistance Sitting-balance support: Feet supported Sitting balance-Leahy Scale: Good     Standing balance support: Bilateral upper extremity supported, During functional activity, Reliant on assistive device for balance, Single extremity supported Standing balance-Leahy Scale: Poor Standing balance comment: fair static with UE on counter, poor dynamic requiring BUE support                           ADL either performed or assessed with clinical judgement   ADL Overall ADL's : Needs assistance/impaired     Grooming: Standing;Wash/dry face;Oral care;Contact guard assist;Cueing for sequencing Grooming Details (indicate cue type and reason): LUE on counter for stability, able to utilize RUE to brush teeth with impaired quality of movement, CGA to hold and bring cup to mouth, able to maintain water to swish and spit into sink without unintentional spillage this date. Minimal spillage overall with task.                             Functional mobility during ADLs: Contact guard assist;Rolling walker (2 wheels);Minimal assistance      Extremity/Trunk Assessment              Vision       Perception     Praxis     Communication Communication Communication: Impaired Factors Affecting Communication: Reduced clarity of speech;Difficulty expressing self   Cognition Arousal: Alert Behavior During Therapy: Flat affect Cognition: Cognition impaired     Awareness: Online awareness impaired  Executive functioning impairment (select all impairments): Reasoning, Problem solving OT - Cognition Comments: slow processing                 Following commands: Impaired Following commands impaired: Follows one step commands with increased time,  Follows multi-step commands with increased time      Cueing   Cueing Techniques: Verbal cues  Exercises      Shoulder Instructions       General Comments patient set-up with lunch tray at end of session in upright position in bed    Pertinent Vitals/ Pain       Pain Assessment Pain Assessment: No/denies pain  Home Living                                          Prior Functioning/Environment              Frequency  Min 3X/week        Progress Toward Goals  OT Goals(current goals can now be found in the care plan section)  Progress towards OT goals: Progressing toward goals  Acute Rehab OT Goals Patient Stated Goal: get better OT Goal Formulation: With patient Time For Goal Achievement: 08/11/24 Potential to Achieve Goals: Good  Plan      Co-evaluation                 AM-PAC OT 6 Clicks Daily Activity     Outcome Measure   Help from another person eating meals?: A Little Help from another person taking care of personal grooming?: A Little Help from another person toileting, which includes using toliet, bedpan, or urinal?: A Little Help from another person bathing (including washing, rinsing, drying)?: A Little Help from another person to put on and taking off regular upper body clothing?: A Little Help from another person to put on and taking off regular lower body clothing?: A Little 6 Click Score: 18    End of Session Equipment Utilized During Treatment: Rolling walker (2 wheels)  OT Visit Diagnosis: Hemiplegia and hemiparesis;Other symptoms and signs involving cognitive function;Feeding difficulties (R63.3);Cognitive communication deficit (R41.841) Symptoms and signs involving cognitive functions: Cerebral infarction Hemiplegia - Right/Left: Right Hemiplegia - dominant/non-dominant: Dominant Hemiplegia - caused by: Cerebral infarction   Activity Tolerance Patient tolerated treatment well   Patient Left in bed;with call  bell/phone within reach;with bed alarm set   Nurse Communication          Time: 8472-8453 OT Time Calculation (min): 19 min  Charges: OT General Charges $OT Visit: 1 Visit OT Treatments $Self Care/Home Management : 8-22 mins  Warren SAUNDERS., MPH, MS, OTR/L ascom [phone] 07/29/24, 4:08 PM

## 2024-07-29 NOTE — Progress Notes (Signed)
 Chaplain attempted to see pt as per directed by on call chaplain, patient busy with medical personnel. Let patient know that chaplain was circling back to extend care.  Joseph Clements Chaplain Intern-[facility]

## 2024-07-29 NOTE — Progress Notes (Addendum)
  Progress Note   Patient: Joseph Clements FMW:[id number] DOB: February 25, 1941 DOA: 07/27/2024     2 DOS: the patient was seen and examined on 07/29/2024   Brief hospital course: Joseph Clements is a 83 y.o. male with medical history significant of HTN, DM, anxiety/depression, BPH, presented with slurred speech. 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 Patient is seen by neurosurgery, okay to start dual antiplatelet treatment.   Principal Problem:   Stroke (cerebrum) (HCC) Active Problems:   Overweight (BMI 25.0-29.9)   Essential hypertension   CKD stage 3a, GFR 45-59 ml/min (HCC)   Hypokalemia   Subdural hematoma, chronic (HCC)   Right sided weakness   Dysarthria   Facial droop   Hemorrhagic stroke (HCC)   Assessment and Plan:  Left basal ganglia and ischemic stroke. Small subdural hematoma. Appreciate neurology and neurosurgery consult, continue statin and dual antiplatelet treatment.  Patient has been seen by PT/OT, still has sickening weakness, recommended acute rehab. Patient had a worsening dysarthria yesterday, repeated CT scan and MRI did not show worsening stroke or bleeding.  EEG scheduled for neurology. Echo: EF 60-65%, grade 1 diastolic dysfunction.  No valvular disease.  No LA dilation.  No PFO.  Continue dual antiplatelet treatment and statin.  Patient currently pending acute rehab placement.   Chronic kidney disease stage IIIa. Hypokalemia. Potassium normalized, renal function stable.   Essential hypertension. Blood pressure running higher, may consider starting blood pressure medicine tomorrow if still running high      Subjective:  Patient still has significant dysarthria, generalized weakness.  Physical Exam: Vitals:   07/28/24 1547 07/28/24 2000 07/29/24 0000 07/29/24 0800  BP: (!) 183/96 (!) 191/91 (!) 182/104 (!) 181/81  Pulse:  96 90 86  Resp: 18  16   Temp: 97.8 F (36.6 C) (!) 97.4 F (36.3 C) (!)  97.5 F (36.4 C) 98.1 F (36.7 C)  TempSrc: Oral Oral Oral Oral  SpO2: 98% 97% 95% 99%  Weight:      Height:       General exam: Appears calm and comfortable  Respiratory system: Clear to auscultation. Respiratory effort normal. Cardiovascular system: S1 & S2 heard, RRR. No JVD, murmurs, rubs, gallops or clicks. No pedal edema. Gastrointestinal system: Abdomen is nondistended, soft and nontender. No organomegaly or masses felt. Normal bowel sounds heard. Central nervous system: Alert and oriented x3.  Slurred speech. Extremities: Symmetric 5 x 5 power. Skin: No rashes, lesions or ulcers Psychiatry: Judgement and insight appear normal. Mood & affect appropriate.    Data Reviewed:  Reviewed MRI and CT scan.  Family Communication: None  Disposition: Status is: Inpatient Remains inpatient appropriate because: Severity of disease, pending placement.     Time spent: 35 minutes  Author: Murvin Mana, MD 07/29/2024 11:28 AM  For on call review [email or web address].

## 2024-07-29 NOTE — Progress Notes (Signed)
 SLP Cancellation Note  Patient Details Name: Joseph Clements MRN: [id number] DOB: [birth year]   Cancelled treatment:       Reason Eval/Treat Not Completed: Patient at procedure or test/unavailable   Kharson Rasmusson 07/29/2024, 2:43 PM

## 2024-07-29 NOTE — Progress Notes (Signed)
 Eeg done

## 2024-07-29 NOTE — Telephone Encounter (Signed)
 Order for CT scan placed with a note to be scheduled around 08/29/2024

## 2024-07-29 NOTE — Progress Notes (Signed)
 Physical Medicine & Rehabilitation Consult Service  Pt discussed with rehab admissions coordinator. Chart has been reviewed. This is an 83 year old male admitted 11/30 for a subacute stroke in the left basal ganglia resulting in right-sided hemiparesis.  Etiology likely small vessel disease.  He also was found to have bilateral subdural collections.  Neurosurgery was consulted and recommended conservative management.  Patient was sudden worsening of his exam yesterday but neither follow-up head CT or MRI demonstrated any acute changes.  Other active medical issues include obesity, hypertension, CKD stage III AA and hypokalemia.   Home: Home Living Family/patient expects to be discharged to:: Private residence Living Arrangements: Spouse/significant other Available Help at Discharge: Family, Available PRN/intermittently Type of Home: House Home Access: Stairs to enter Home Equipment: Rollator (4 wheels) Additional Comments: unsure, no family present patient with difficulty speaking  Lives With: Spouse  Functional History: Prior Function Prior Level of Function : Independent/Modified Independent, Driving Mobility Comments: endorses ~3 falls/70mo, uses rollator sometimes in and outside the home Functional Status:  Mobility: Bed Mobility Overal bed mobility: Needs Assistance Bed Mobility: Supine to Sit Supine to sit: Mod assist, Used rails, HOB elevated General bed mobility comments: mod assist to scoot out to edge of bed. Min A to bring R LE off bed. Transfers Overall transfer level: Needs assistance Equipment used: Rolling walker (2 wheels) Transfers: Sit to/from Stand Sit to Stand: Min assist General transfer comment: increased time, effort Ambulation/Gait Ambulation/Gait assistance: Contact guard assist Gait Distance (Feet): 25 Feet Assistive device: Rolling walker (2 wheels) Gait Pattern/deviations: Step-through pattern, Decreased step length - right, Decreased stride  length General Gait Details: Patient demonstrates weakness on R side. Decreased step length on right and foot clearance. Gait velocity: decreased    ADL: ADL Overall ADL's : Needs assistance/impaired Eating/Feeding: Supervision/ safety, Cueing for compensatory techinques, Cueing for safety, Minimal assistance Eating/Feeding Details (indicate cue type and reason): Pt attempts to use R hand to help load the spoon held in L hand otherwise uses L non-dom hand to do majority of self feeding (pt later reports that he has been using LUE as dominant since injury to RUE ~2.5 yrs ago), moderate spillage, over stuffs his mouth prior to swallowing bite requiring VC. Appears to pocket food and has difficulty clearing food from cheeks requiring VC. Drools/spills out of the R side of his mouth. Cues for clearing throat. Some coughing noted. SLP and MD notified. Grooming: Sitting, Set up, Minimal assistance Grooming Details (indicate cue type and reason): MIN A for thoroughness with wiping nose, mouth Upper Body Dressing : Sitting, Minimal assistance, Moderate assistance Lower Body Dressing: Sitting/lateral leans, Moderate assistance, Minimal assistance Lower Body Dressing Details (indicate cue type and reason): don/doff socks seated EOB, more difficult on R side Toilet Transfer: Contact guard assist, Minimal assistance, BSC/3in1, Rolling walker (2 wheels), Ambulation Toilet Transfer Details (indicate cue type and reason): simulated Functional mobility during ADLs: Contact guard assist, Rolling walker (2 wheels)  Cognition: Cognition Overall Cognitive Status: Difficult to assess Arousal/Alertness: Awake/alert Orientation Level: Oriented X4 Year:  (1924) Month: December Day of Week: Correct Attention: Selective Selective Attention: Appears intact Cognition Arousal: Alert Behavior During Therapy: Lability Overall Cognitive Status: Difficult to assess   Assessment: 83 year old male with left basal  ganglia infarct and small subdural hematomas   Plan:  This patient would benefit from acute inpatient rehab to address functional mobility, self-care, speech and swallowing. Additionally, the patient requires daily MD oversight of the active medical issues noted above. Projected goals would be  supervision to modified independent with an ELOS of 7 days.  Dispo and social supports are appropriate.    Rehab Admissions Coordinator to follow up.    Arthea IVAR Gunther, MD, [facility] Physical Medicine & Rehabilitation Medical Director Rehabilitation Services 07/29/2024

## 2024-07-29 NOTE — Progress Notes (Signed)
  Inpatient Rehabilitation Admissions Coordinator   I contacted patient's wife by phone for rehab assessment.  Noted Dr Naaman consult. We discussed goals and expectations of a possible CIR admit. She works 28 hrs per week at [employer] in [place]. Their daughter died years ago and there are no additional family that can provide caregiver supports when she works. We do not expect patient to reach Mod I level in a 7 to 10 days ELOS at CIR, therefore we can not offer a CIR admit. Recommend SNF for longer recovery before he returns home with intermittent assist of wife, as she works. She is in agreement and prefers SNF near their home. I will alert acute team and TOC. We will sign off. Please call me with any questions.   Heron Leavell, RN, MSN Rehab Admissions Coordinator [phone]

## 2024-07-29 NOTE — Progress Notes (Signed)
 Physical Therapy Treatment Patient Details Name: Joseph Clements MRN: [id number] DOB: 1941-01-28 Today's Date: 07/29/2024   History of Present Illness Joseph Clements is a 83 y.o. male with medical history significant of HTN, DM, anxiety/depression, BPH, presented with slurred speech. 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    PT Comments  Patient is agreeable to PT session. He continues to require assistance with mobility. Progressed total gait distance today with moderate cues for gait pattern and safety using rolling walker. He would continue to benefit from PT to maximize independence and decrease caregiver burden.    If plan is discharge home, recommend the following: A little help with walking and/or transfers;A lot of help with bathing/dressing/bathroom;Assistance with feeding;Assistance with cooking/housework;Supervision due to cognitive status;Help with stairs or ramp for entrance   Can travel by private vehicle        Equipment Recommendations  Rolling walker (2 wheels)    Recommendations for Other Services Rehab consult     Precautions / Restrictions Precautions Precautions: Fall Recall of Precautions/Restrictions: Impaired Restrictions Weight Bearing Restrictions Per Provider Order: No     Mobility  Bed Mobility Overal bed mobility: Needs Assistance Bed Mobility: Supine to Sit, Sit to Supine     Supine to sit: Mod assist Sit to supine: Min assist   General bed mobility comments: cues for task segmentation and sequencing. faciliation for right hemibody    Transfers Overall transfer level: Needs assistance Equipment used: Rolling walker (2 wheels) Transfers: Sit to/from Stand Sit to Stand: Min assist           General transfer comment: cues for hand placement and to avoid pulling up on rolling walker    Ambulation/Gait Ambulation/Gait assistance: Min assist Gait Distance (Feet): 60  Feet Assistive device: Rolling walker (2 wheels) Gait Pattern/deviations: Step-through pattern, Decreased stride length, Shuffle, Drifts right/left, Wide base of support Gait velocity: decreased     General Gait Details: decreased step length more pronounced with fatigue. steadying assistance provided in addition to intermittent rolling walker negotation as patient veers to the right. moderate cues required for improved safety and gait   Stairs             Wheelchair Mobility     Tilt Bed    Modified Rankin (Stroke Patients Only)       Balance Overall balance assessment: History of Falls, Needs assistance Sitting-balance support: Feet supported Sitting balance-Leahy Scale: Good     Standing balance support: Bilateral upper extremity supported, During functional activity, Reliant on assistive device for balance Standing balance-Leahy Scale: Poor Standing balance comment: external support required with reliance on rolling walker for support                            Communication Communication Communication: Impaired Factors Affecting Communication: Reduced clarity of speech;Difficulty expressing self  Cognition Arousal: Alert Behavior During Therapy: Flat affect   PT - Cognitive impairments: Difficult to assess Difficult to assess due to: Impaired communication                       Following commands: Impaired Following commands impaired: Follows one step commands with increased time, Follows multi-step commands with increased time    Cueing Cueing Techniques: Verbal cues  Exercises      General Comments General comments (skin integrity, edema, etc.): patient set-up with lunch tray at end of session  in upright position in bed      Pertinent Vitals/Pain Pain Assessment Pain Assessment: No/denies pain    Home Living                          Prior Function            PT Goals (current goals can now be found in the  care plan section) Acute Rehab PT Goals Patient Stated Goal: improve PT Goal Formulation: With patient Time For Goal Achievement: 08/11/24 Potential to Achieve Goals: Good Progress towards PT goals: Progressing toward goals    Frequency    Min 4X/week      PT Plan      Co-evaluation              AM-PAC PT 6 Clicks Mobility   Outcome Measure  Help needed turning from your back to your side while in a flat bed without using bedrails?: A Little Help needed moving from lying on your back to sitting on the side of a flat bed without using bedrails?: A Lot Help needed moving to and from a bed to a chair (including a wheelchair)?: A Little Help needed standing up from a chair using your arms (e.g., wheelchair or bedside chair)?: A Little Help needed to walk in hospital room?: A Little Help needed climbing 3-5 steps with a railing? : A Lot 6 Click Score: 16    End of Session Equipment Utilized During Treatment: Gait belt Activity Tolerance: Patient tolerated treatment well Patient left: in bed;with call bell/phone within reach;with bed alarm set   PT Visit Diagnosis: Other abnormalities of gait and mobility (R26.89);Muscle weakness (generalized) (M62.81);Difficulty in walking, not elsewhere classified (R26.2);Unsteadiness on feet (R26.81);Hemiplegia and hemiparesis     Time: 1256-1316 PT Time Calculation (min) (ACUTE ONLY): 20 min  Charges:    $Gait Training: 8-22 mins PT General Charges $$ ACUTE PT VISIT: 1 Visit                     Randine Essex, PT, MPT    Randine LULLA Essex 07/29/2024, 2:11 PM

## 2024-07-29 NOTE — Plan of Care (Signed)
  Problem: Education: Goal: Ability to describe self-care measures that may prevent or decrease complications (Diabetes Survival Skills Education) will improve Outcome: Progressing Goal: Individualized Educational Video(s) Outcome: Progressing   Problem: Nutritional: Goal: Maintenance of adequate nutrition will improve Outcome: Progressing   Problem: Skin Integrity: Goal: Risk for impaired skin integrity will decrease Outcome: Progressing

## 2024-07-30 DIAGNOSIS — I639 Cerebral infarction, unspecified: Secondary | ICD-10-CM | POA: Diagnosis not present

## 2024-07-30 LAB — BASIC METABOLIC PANEL WITH GFR
Anion gap: 13 (ref 5–15)
BUN: 25 mg/dL — ABNORMAL HIGH (ref 8–23)
CO2: 23 mmol/L (ref 22–32)
Calcium: 9.6 mg/dL (ref 8.9–10.3)
Chloride: 103 mmol/L (ref 98–111)
Creatinine, Ser: 1.35 mg/dL — ABNORMAL HIGH (ref 0.61–1.24)
GFR, Estimated: 52 mL/min — ABNORMAL LOW (ref >60)
Glucose, Bld: 188 mg/dL — ABNORMAL HIGH (ref 70–99)
Potassium: 4.3 mmol/L (ref 3.5–5.1)
Sodium: 139 mmol/L (ref 135–145)

## 2024-07-30 LAB — GLUCOSE, CAPILLARY
Glucose-Capillary: 158 mg/dL — ABNORMAL HIGH (ref 70–99)
Glucose-Capillary: 205 mg/dL — ABNORMAL HIGH (ref 70–99)
Glucose-Capillary: 207 mg/dL — ABNORMAL HIGH (ref 70–99)

## 2024-07-30 LAB — MAGNESIUM: Magnesium: 2.3 mg/dL (ref 1.7–2.4)

## 2024-07-30 LAB — PHOSPHORUS: Phosphorus: 4.2 mg/dL (ref 2.5–4.6)

## 2024-07-30 LAB — VITAMIN D 25 HYDROXY (VIT D DEFICIENCY, FRACTURES): Vit D, 25-Hydroxy: 30.72 ng/mL (ref 30–100)

## 2024-07-30 LAB — VITAMIN B12: Vitamin B-12: 213 pg/mL (ref 180–914)

## 2024-07-30 MED ORDER — CLOPIDOGREL BISULFATE 75 MG PO TABS
75.0000 mg | ORAL_TABLET | Freq: Every day | ORAL | Status: DC
  Administered 2024-07-31 – 2024-08-01 (×2): 75 mg via ORAL
  Filled 2024-07-30 (×2): qty 1

## 2024-07-30 MED ORDER — METOPROLOL TARTRATE 25 MG PO TABS
25.0000 mg | ORAL_TABLET | Freq: Two times a day (BID) | ORAL | Status: DC
  Administered 2024-07-30 – 2024-08-01 (×5): 25 mg via ORAL
  Filled 2024-07-30 (×5): qty 1

## 2024-07-30 MED ORDER — BISACODYL 10 MG RE SUPP: 10.0000 mg | Freq: Every day | RECTAL | Status: DC | PRN

## 2024-07-30 MED ORDER — ATORVASTATIN CALCIUM 80 MG PO TABS
80.0000 mg | ORAL_TABLET | Freq: Every day | ORAL | Status: DC
  Administered 2024-07-31 – 2024-08-01 (×2): 80 mg via ORAL
  Filled 2024-07-30: qty 1
  Filled 2024-07-30 (×2): qty 4
  Filled 2024-07-30: qty 1

## 2024-07-30 MED ORDER — POLYETHYLENE GLYCOL 3350 17 G PO PACK
17.0000 g | PACK | Freq: Two times a day (BID) | ORAL | Status: DC
  Administered 2024-07-30 – 2024-08-01 (×5): 17 g via ORAL
  Filled 2024-07-30 (×5): qty 1

## 2024-07-30 MED ORDER — LOSARTAN POTASSIUM 50 MG PO TABS
50.0000 mg | ORAL_TABLET | Freq: Every day | ORAL | Status: DC
  Administered 2024-07-30 – 2024-08-01 (×3): 50 mg via ORAL
  Filled 2024-07-30 (×3): qty 1

## 2024-07-30 MED ORDER — LABETALOL HCL 5 MG/ML IV SOLN
10.0000 mg | Freq: Once | INTRAVENOUS | Status: AC
  Administered 2024-07-30: 10 mg via INTRAVENOUS
  Filled 2024-07-30: qty 4

## 2024-07-30 MED ORDER — BISACODYL 10 MG RE SUPP
10.0000 mg | Freq: Once | RECTAL | Status: DC
  Filled 2024-07-30: qty 1

## 2024-07-30 NOTE — Progress Notes (Signed)
 Speech Language Pathology Treatment: Cognitive-Linguistic  Patient Details Name: Joseph Clements MRN: [id number] DOB: 12-19-1940 Today's Date: 07/30/2024 Time: 8871-8854 SLP Time Calculation (min) (ACUTE ONLY): 17 min  Assessment / Plan / Recommendation Clinical Impression  Pt seen for ongoing dysarthria treatment. During today's session pt with symptoms of potential cognitive deficits as evidenced by repeatedly asking the same question within minutes of being given information. While it is difficult to fully assess his cognitive abilities d/t severity of word finding deficits and dysarthria, pt would recommend from review of information pertaining to medical POC.   With maximal multimodal cues, pt able to slow rate and increased vocal intensity minimally to achieve ~ 25% speech intelligibility at the sentence level and ~ 50% at the word to simple phrase level. His speech intelligibility was also impacted by verbal repairs (related to word finding difficulty) making it difficult for listener to retain information within communicative expression. Pt demonstrates emergent awareness and intermittent ability to correct himself during moments of word finding difficulty. Pt would benefit from asking yes/no question, reduction of distractions during comprehension and expression tasks as well as repetition of information.   Pt's wife called while this writer was in pt's room. Education provided to her on current ST POC. All questions answered to the best of this writer's ability.    HPI HPI: Joseph Clements is a 83 year old male with history of diabetes presenting to the ER for evaluation of right-sided weakness and slurred speech on 07/27/2024.  Per EMS last known well at 1:30 PM yesterday.  Patient tells me that he felt normal yesterday at lunch and dinner, but sometime before bed noticed that his speech was abnormal.  Did not note that he had a facial droop until this morning.  Patient reportedly had a  witnessed fall this morning after which patient was noted to have a facial droop and weakness with concerns for stroke. 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 Small symmetric bilateral subdural collections overlying the cerebral convexities bilaterally, measuring up to 3 mm in maximal diameter without mass effect. Upon review of prior CT. These appear  minimally dense in nature, and likely reflect small subacute subdural hematomas, although mildly complex hygromas could also have this appearance.  Underlying age-related cerebral atrophy with mild chronic small vessel ischemic disease.      SLP Plan  Continue with current plan of care                    Oral care BID   Frequent or constant Supervision/Assistance Dysarthria and anarthria (R47.1);Cognitive communication deficit (R41.841)     Continue with current plan of care    Zohar Maroney B. Rubbie, M.S., CCC-SLP, Tree Surgeon Certified Brain Injury Specialist [facility]  [facility] Rehabilitation Services Office [phone] Ascom [phone] Fax [phone]

## 2024-07-30 NOTE — Care Management Important Message (Signed)
 Important Message  Patient Details  Name: Joseph Clements MRN: [id number] Date of Birth: Aug 30, 1940   Important Message Given:  Yes - Medicare IM     Adarius Tigges W, CMA 07/30/2024, 12:45 PM

## 2024-07-30 NOTE — Progress Notes (Signed)
  Progress Note   Patient: Joseph Clements FMW:[id number] DOB: Oct 15, 1940 DOA: 07/27/2024     3 DOS: the patient was seen and examined on 07/30/2024   Brief hospital course: Joseph Clements is a 83 y.o. male with medical history significant of HTN, DM, anxiety/depression, BPH, presented with slurred speech. 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 Patient is seen by neurosurgery, okay to start dual antiplatelet treatment.   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 # Small subdural hematoma. Appreciate neurology and neurosurgery consult,  continue statin and dual antiplatelet treatment for 21 days followed by baby aspirin  81 mg p.o. daily.   Patient has been seen by PT/OT, recommended acute rehab. Patient had a worsening dysarthria on 12/1, repeated CT scan and MRI did not show worsening stroke or bleeding.   EEG report is pending Echo: EF 60-65%, grade 1 diastolic dysfunction.  No valvular disease.  No LA dilation.  No PFO.  Currently patient is stable to discharge, awaiting for placement.    # Chronic kidney disease stage IIIa. # Hypokalemia. Potassium normalized, renal function stable.   # Essential hypertension. 12/3 labetalol  10 mg x 1 dose given Started Lopressor  25 mg p.o. twice daily and losartan  50 mg p.o. daily Monitor BP and titrate medications according  # NIDDM T2 At home patient was on glipizide and Tradjenta  Continue Tradjenta  Started NovoLog  sliding scale during hospital stay Continue diabetic diet, monitor CBG   Body mass index is 28.19 kg/m.   Subjective:  No significant events  overnight. Patient still has dysarthria and generalized weakness, no any other active issues Patient is aware that he is waiting for SNF placement.   Physical Exam: Vitals:   07/29/24 2136 07/30/24 0136 07/30/24 0536 07/30/24 0930  BP: (!) 187/92 (!) 216/97 (!) 194/98 139/82  Pulse: 95 77 86 83  Resp: 16  20   Temp: 97.8 F (36.6 C) 97.8 F (36.6 C) (!) 97.4 F (36.3 C) (!) 97.5 F (36.4 C)  TempSrc: Oral Oral Oral Oral  SpO2: 98% 98% 98% 98%  Weight:      Height:       General exam: Appears calm and comfortable  Respiratory system: Clear to auscultation. Respiratory effort normal. Cardiovascular system: S1 & S2 heard, RRR. No JVD, murmurs, rubs, gallops or clicks. No pedal edema. Gastrointestinal system: Abdomen is nondistended, soft and nontender. No organomegaly or masses felt. Normal bowel sounds heard. Central nervous system: Alert and oriented x3.  Slurred speech and right facial droop Extremities: No edema Skin: No rashes, lesions or ulcers Psychiatry: Judgement and insight appear normal. Mood & affect appropriate.    Data Reviewed:  Reviewed MRI and CT scan.  Family Communication: None  Disposition: Status is: Inpatient Remains inpatient appropriate because: Severity of disease, pending placement.     Time spent: 35 minutes  Author: Elvan Sor, MD 07/30/2024 1:10 PM  For on call review [email or web address].

## 2024-07-30 NOTE — Plan of Care (Signed)

## 2024-07-30 NOTE — NC FL2 (Signed)
 [place]  MEDICAID FL2 LEVEL OF CARE FORM     IDENTIFICATION  Patient Name: Joseph Clements Birthdate: [birth year] Sex: male Admission Date (Current Location): 07/27/2024  [facility] and [place] Number:  Chiropodist and Address:  [facility], [address], [place], [place] [address]      Provider Number: [id number]  Attending Physician Name and Address:  Von Bellis, MD  Relative Name and Phone Number:  [place] [phone]    Current Level of Care: Hospital Recommended Level of Care: Skilled Nursing Facility Prior Approval Number:    Date Approved/Denied:   PASRR Number: [id number]  Discharge Plan: SNF    Current Diagnoses: Patient Active Problem List   Diagnosis Date Noted   Overweight (BMI 25.0-29.9) 07/28/2024   Essential hypertension 07/28/2024   CKD stage 3a, GFR 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/min (HCC) 07/28/2024   Hypokalemia 07/28/2024   Subdural hematoma, chronic (HCC) 07/28/2024   Right sided weakness 07/28/2024   Dysarthria 07/28/2024   Facial droop 07/28/2024   Hemorrhagic stroke (HCC) 07/28/2024   Stroke (cerebrum) (HCC) 07/27/2024   Acute cholecystitis 05/24/2018   Right ureteral stone 10/25/2015   Gross hematuria 10/15/2015   Kidney stones 10/15/2015   BPH with obstruction/lower urinary tract symptoms 10/15/2015   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    Orientation RESPIRATION BLADDER Height & Weight     Self, Time, Situation, Place  Normal External catheter Weight: 81.6 kg Height:  5' 7 (170.2 cm)  BEHAVIORAL SYMPTOMS/MOOD NEUROLOGICAL BOWEL NUTRITION STATUS      Continent Diet (DYS 2)  AMBULATORY STATUS COMMUNICATION OF NEEDS Skin   Limited Assist Verbally Normal                       Personal Care Assistance Level of Assistance  Bathing, Feeding, Dressing Bathing Assistance: Maximum assistance Feeding assistance: Limited assistance Dressing Assistance: Maximum assistance     Functional Limitations Info              SPECIAL CARE FACTORS FREQUENCY  PT (By licensed PT), OT (By licensed OT)     PT Frequency: 5x week OT Frequency: 5x week            Contractures Contractures Info: Not present    Additional Factors Info  Code Status, Allergies Code Status Info: Full Allergies Info: Shellfish           Current Medications (07/30/2024):  This is the current hospital active medication list Current Facility-Administered Medications  Medication Dose Route Frequency Provider Last Rate Last Admin   acetaminophen  (TYLENOL ) tablet 650 mg  650 mg Oral Q4H PRN Laurita Manor T, MD       Or   acetaminophen  (TYLENOL ) 160 MG/5ML solution 650 mg  650 mg Per Tube Q4H PRN Laurita Manor T, MD       Or   acetaminophen  (TYLENOL ) suppository 650 mg  650 mg Rectal Q4H PRN Laurita Manor DASEN, MD       aspirin EC tablet 81 mg  81 mg Oral Daily Stack, Colleen M, MD   81 mg at 07/30/24 0832   [START ON 07/31/2024] atorvastatin (LIPITOR) tablet 80 mg  80 mg Oral Daily Greenwood, Howard F, RPH       bisacodyl (DULCOLAX) suppository 10 mg  10 mg Rectal Once Kumar, Dileep, MD       NOREEN ON 07/31/2024] bisacodyl (DULCOLAX) suppository 10 mg  10 mg Rectal Daily PRN Von Bellis, MD       buPROPion  (WELLBUTRIN   XL) 24 hr tablet 300 mg  300 mg Oral Daily Laurita Manor T, MD   300 mg at 07/30/24 0832   [START ON 07/31/2024] clopidogrel (PLAVIX) tablet 75 mg  75 mg Oral Daily Von Bellis, MD       hydrALAZINE (APRESOLINE) injection 5 mg  5 mg Intravenous Q6H PRN Laurita Manor T, MD   5 mg at 07/30/24 0200   insulin  aspart (novoLOG ) injection 0-9 Units  0-9 Units Subcutaneous TID WC Laurita Manor T, MD   3 Units at 07/30/24 1150   linagliptin (TRADJENTA) tablet 5 mg  5 mg Oral Daily Laurita Manor T, MD   5 mg at 07/30/24 9167   LORazepam (ATIVAN) injection 0.5 mg  0.5 mg Intravenous Q6H PRN Laurita Manor T, MD       losartan (COZAAR) tablet 50 mg  50 mg Oral Daily Von Bellis, MD   50 mg at 07/30/24 0914   metoprolol tartrate (LOPRESSOR)  tablet 25 mg  25 mg Oral BID Von Bellis, MD   25 mg at 07/30/24 0914   pantoprazole (PROTONIX) EC tablet 40 mg  40 mg Oral Daily Laurita Manor T, MD   40 mg at 07/30/24 9167   polyethylene glycol (MIRALAX / GLYCOLAX) packet 17 g  17 g Oral BID Von Bellis, MD   17 g at 07/30/24 9074   senna-docusate (Senokot-S) tablet 2 tablet  2 tablet Oral BID Laurita Pillion, MD   2 tablet at 07/30/24 9167     Discharge Medications: Please see discharge summary for a list of discharge medications.  Relevant Imaging Results:  Relevant Lab Results:   Additional Information SS# [id number]  Daved JONETTA Hamilton, RN

## 2024-07-30 NOTE — Progress Notes (Signed)
 Mobility Specialist - Progress Note   07/30/24 1600  Mobility  Activity Ambulated with assistance;Stood at bedside  Level of Assistance Contact guard assist, steadying assist  Assistive Device Front wheel walker  Distance Ambulated (ft) 180 ft  Range of Motion/Exercises Active;All extremities  Activity Response Tolerated well  Mobility visit 1 Mobility  Mobility Specialist Start Time (ACUTE ONLY) 1617  Mobility Specialist Stop Time (ACUTE ONLY) 1640  Mobility Specialist Time Calculation (min) (ACUTE ONLY) 23 min   Pt was supine in bed with the HOB elevated on RA. Pt agreed to mobility. Pt has R UE and R LE weakness. Pt is able today to get to the EOB with minA CGA. Pt is able today to STS with 2 WW minA CGA. Pt ambulated well. Pt did not need recovery break throughout activity. After activity pt returned to the room back in bed with needs in reach. Nurses were in the room upon exit.  Clem Rodes Mobility Specialist 07/30/24, 4:55 PM

## 2024-07-30 NOTE — Progress Notes (Signed)
 Occupational Therapy Treatment Patient Details Name: Joseph Clements MRN: [id number] DOB: 12-08-1940 Today's Date: 07/30/2024   History of present illness Joseph Clements is a 83 y.o. male with medical history significant of HTN, DM, anxiety/depression, BPH, presented with slurred speech. 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   OT comments  Chart reviewed to date, pt greeted semi supine in bed, oriented x4 with continued improved speech n this date. He does continues to presnt with slow processing and word finding difficulties at times. Pt is agreeable to OT tx session targeting improving functional activity tolerance in prep for ADL tasks. Please see further details below. Frequent multi modal cues throughout for technique.  Pt continues to perform ADL/functional mobility below PLOF, will benefit from acute OT to address functional deficits and to facilitate return to PLOF.       If plan is discharge home, recommend the following:  A little help with walking and/or transfers;A little help with bathing/dressing/bathroom;Assistance with cooking/housework;Assist for transportation;Help with stairs or ramp for entrance;Assistance with feeding;Direct supervision/assist for medications management;Supervision due to cognitive status;Direct supervision/assist for financial management   Equipment Recommendations  Other (comment) (defer to next venue of care)    Recommendations for Other Services      Precautions / Restrictions Precautions Precautions: Fall Recall of Precautions/Restrictions: Impaired Restrictions Weight Bearing Restrictions Per Provider Order: No       Mobility Bed Mobility Overal bed mobility: Needs Assistance Bed Mobility: Supine to Sit     Supine to sit: Min assist, Mod assist, HOB elevated, Used rails          Transfers Overall transfer level: Needs assistance Equipment used: Rolling walker (2  wheels) Transfers: Sit to/from Stand Sit to Stand: Min assist                 Balance Overall balance assessment: History of Falls, Needs assistance Sitting-balance support: Feet supported Sitting balance-Leahy Scale: Good     Standing balance support: Bilateral upper extremity supported, During functional activity, Reliant on assistive device for balance, Single extremity supported Standing balance-Leahy Scale: Poor                             ADL either performed or assessed with clinical judgement   ADL Overall ADL's : Needs assistance/impaired     Grooming: Wash/dry face;Sitting;Supervision/safety   Upper Body Bathing: Minimal assistance;Sitting   Lower Body Bathing: Maximal assistance;Sit to/from stand   Upper Body Dressing : Sitting;Minimal assistance   Lower Body Dressing: Sitting/lateral leans;Maximal assistance   Toilet Transfer: Minimal assistance;Rolling walker (2 wheels);Cueing for sequencing Toilet Transfer Details (indicate cue type and reason): simulated to bedside chair Toileting- Clothing Manipulation and Hygiene: Maximal assistance;Sit to/from stand       Functional mobility during ADLs: Contact guard assist;Rolling walker (2 wheels);Cueing for sequencing (approx 15')      Extremity/Trunk Assessment              Vision       Perception     Praxis     Communication Communication Communication: Impaired Factors Affecting Communication: Reduced clarity of speech;Difficulty expressing self   Cognition Arousal: Alert Behavior During Therapy: Flat affect Cognition: Cognition impaired     Awareness: Online awareness impaired   Attention impairment (select first level of impairment): Sustained attention Executive functioning impairment (select all impairments): Reasoning, Problem solving  Following commands: Impaired Following commands impaired: Follows one step commands with increased time,  Follows multi-step commands with increased time      Cueing   Cueing Techniques: Verbal cues, Tactile cues, Visual cues  Exercises Other Exercises Other Exercises: edu re role of OT, role of rehab, discharge recommendations    Shoulder Instructions       General Comments BP 184/81 (111), spo2 98% on RA, HR 95 bpm post mobility, nurse notified;    Pertinent Vitals/ Pain       Pain Assessment Pain Assessment: No/denies pain  Home Living                                          Prior Functioning/Environment              Frequency  Min 3X/week        Progress Toward Goals  OT Goals(current goals can now be found in the care plan section)  Progress towards OT goals: Progressing toward goals  Acute Rehab OT Goals Time For Goal Achievement: 08/11/24  Plan      Co-evaluation                 AM-PAC OT 6 Clicks Daily Activity     Outcome Measure   Help from another person eating meals?: A Little Help from another person taking care of personal grooming?: A Little Help from another person toileting, which includes using toliet, bedpan, or urinal?: A Little Help from another person bathing (including washing, rinsing, drying)?: A Little Help from another person to put on and taking off regular upper body clothing?: A Little Help from another person to put on and taking off regular lower body clothing?: A Little 6 Click Score: 18    End of Session Equipment Utilized During Treatment: Rolling walker (2 wheels)  OT Visit Diagnosis: Hemiplegia and hemiparesis;Other symptoms and signs involving cognitive function;Feeding difficulties (R63.3);Cognitive communication deficit (R41.841) Symptoms and signs involving cognitive functions: Cerebral infarction Hemiplegia - Right/Left: Right Hemiplegia - dominant/non-dominant: Dominant Hemiplegia - caused by: Cerebral infarction   Activity Tolerance Patient tolerated treatment well   Patient Left  in chair;with call bell/phone within reach;with chair alarm set   Nurse Communication Mobility status;Other (comment) (vital signs)        Time: 9154-9085 OT Time Calculation (min): 29 min  Charges: OT General Charges $OT Visit: 1 Visit OT Treatments $Self Care/Home Management : 8-22 mins $Therapeutic Activity: 8-22 mins  Therisa Sheffield, OTD OTR/L  07/30/24, 9:49 AM

## 2024-07-30 NOTE — Procedures (Signed)
 Routine EEG Report  Joseph Clements is a 83 y.o. male with a history of altered mental status who is undergoing an EEG to evaluate for seizures.  Report: This EEG was acquired with electrodes placed according to the International 10-20 electrode system (including Fp1, Fp2, F3, F4, C3, C4, P3, P4, O1, O2, T3, T4, T5, T6, A1, A2, Fz, Cz, Pz). The following electrodes were missing or displaced: none.  The occipital dominant rhythm was 9 Hz. This activity is reactive to stimulation. Drowsiness was manifested by background fragmentation; deeper stages of sleep were identified by K complexes and sleep spindles. There was no focal slowing. There were no interictal epileptiform discharges. There were no electrographic seizures identified.   Impression: This EEG was obtained while awake and asleep and is normal.    Clinical Correlation: Normal EEGs, however, do not rule out epilepsy.  Elida Ross, MD [facility] [phone]  If 7pm- 7am, please page neurology on call as listed in AMION.

## 2024-07-30 NOTE — Progress Notes (Signed)
 Physical Therapy Treatment Patient Details Name: Joseph Clements MRN: [id number] DOB: 01/03/1941 Today's Date: 07/30/2024   History of Present Illness Joseph Clements is a 83 y.o. male with medical history significant of HTN, DM, anxiety/depression, BPH, presented with slurred speech. 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    PT Comments  Pt continued to demonstrate heavy Rt sided deficits, requires minA facilitation for bed mobility, minGuard to frequent minA for LOB recovery in standing, disorientation to line of gravity with signs of improvement within session, advanced AMB from straight plane to frontal plane this date, a general struggle when moving Right. Pt assisted with bed side balance and strength, as well as ADL assist. Handoff to CNA to assist with purewick diagnosics. Will continue to follow.    If plan is discharge home, recommend the following: A little help with walking and/or transfers;A lot of help with bathing/dressing/bathroom;Assistance with feeding;Assistance with cooking/housework;Supervision due to cognitive status;Help with stairs or ramp for entrance   Can travel by private vehicle        Equipment Recommendations  Rolling walker (2 wheels)    Recommendations for Other Services Rehab consult     Precautions / Restrictions Precautions Precautions: Fall Recall of Precautions/Restrictions: Intact Restrictions Weight Bearing Restrictions Per Provider Order: No     Mobility  Bed Mobility Overal bed mobility: Needs Assistance Bed Mobility: Supine to Sit, Sit to Supine     Supine to sit: Min assist Sit to supine: Min assist        Transfers Overall transfer level: Needs assistance Equipment used: Rolling walker (2 wheels) Transfers: Sit to/from Stand Sit to Stand: Contact guard assist           General transfer comment: see exercises section of note    Ambulation/Gait Ambulation/Gait  assistance: Contact guard assist Gait Distance (Feet): 42 Feet Assistive device: Rolling walker (2 wheels) (or countertop) Gait Pattern/deviations: Shuffle, Wide base of support, Leaning posteriorly Gait velocity: increased   Pre-gait activities: static standing x3, cues for postural adjustments for improved balance     Stairs             Wheelchair Mobility     Tilt Bed    Modified Rankin (Stroke Patients Only)       Balance                                            Communication Communication Factors Affecting Communication: Reduced clarity of speech;Difficulty expressing self  Cognition Arousal: Alert Behavior During Therapy: WFL for tasks assessed/performed   PT - Cognitive impairments: No apparent impairments Difficult to assess due to: Impaired Building Control Surveyor    Exercises Other Exercises Other Exercises: 3x60sec STS c RW, education on improving forward lean and push through RW Other Exercises: seated edge of bed x3 minutes repeat cues to improve lateral lean, come to center vi aovercompensation Other Exercises: STS from EOB x10, minguardA (must stay up until balance established prior to sitting) This i smore helpful for improving standing balance than prior interventions Other Exercises: AMB to bed to/from counter with RW, minGuard A Other Exercises: lateral stepping with 2 hands on counter 3x bilat (35ft  total) more difficulty wth Rt side stepping, graviatates toward Rt turn for foward stepping to right, author allows.    General Comments        Pertinent Vitals/Pain Pain Assessment Pain Assessment: No/denies pain    Home Living                          Prior Function            PT Goals (current goals can now be found in the care plan section) Acute Rehab PT Goals Patient Stated Goal: improve PT Goal Formulation: With patient Time For Goal Achievement:  08/11/24 Potential to Achieve Goals: Good Progress towards PT goals: Progressing toward goals    Frequency    Min 3X/week      PT Plan      Co-evaluation              AM-PAC PT 6 Clicks Mobility   Outcome Measure  Help needed turning from your back to your side while in a flat bed without using bedrails?: A Lot Help needed moving from lying on your back to sitting on the side of a flat bed without using bedrails?: A Lot Help needed moving to and from a bed to a chair (including a wheelchair)?: A Little Help needed standing up from a chair using your arms (e.g., wheelchair or bedside chair)?: A Little Help needed to walk in hospital room?: A Lot Help needed climbing 3-5 steps with a railing? : A Lot 6 Click Score: 14    End of Session Equipment Utilized During Treatment: Gait belt Activity Tolerance: Patient tolerated treatment well;No increased pain;Patient limited by fatigue Patient left: in bed;with bed alarm set;with nursing/sitter in room;with call bell/phone within reach Nurse Communication: Mobility status PT Visit Diagnosis: Other abnormalities of gait and mobility (R26.89);Muscle weakness (generalized) (M62.81);Difficulty in walking, not elsewhere classified (R26.2);Unsteadiness on feet (R26.81);Hemiplegia and hemiparesis Hemiplegia - Right/Left: Right Hemiplegia - dominant/non-dominant: Dominant Hemiplegia - caused by: Cerebral infarction     Time: 8543-8473 PT Time Calculation (min) (ACUTE ONLY): 30 min  Charges:    $Therapeutic Activity: 8-22 mins $Neuromuscular Re-education: 8-22 mins PT General Charges $$ ACUTE PT VISIT: 1 Visit                    4:02 PM, 07/30/24 Peggye JAYSON Linear, PT, DPT Physical Therapist - [facility]  [phone] (ASCOM)     Ercia Crisafulli C 07/30/2024, 3:53 PM

## 2024-07-30 NOTE — TOC Progression Note (Addendum)
 Transition of Care [facility]) - Progression Note    Patient Details  Name: LUIAN SCHUMPERT MRN: [id number] Date of Birth: 07-Apr-1941  [facility]) CM/SW Contact  Daved JONETTA Hamilton, RN Phone Number: 07/30/2024, 12:01 PM  Clinical Narrative:     Notified on 07/29/2024 patient is not a candidate for CIR. TOC to begin SNF workup.  Notified by OT that patient could be potential IPR at other facilities, [facility] will include referrals for IPR as well.   PASRR obtained [id number] A FL2 sent for signature Referrals for SNF sent in HUB, patient and patient's spouse verbally request only SNF facilities in and close to [place], [place] Referrals sent to [facility] and [facility] for consideration of IPR  Expected Discharge Plan: IP Rehab Facility Barriers to Discharge: Continued Medical Work up               Expected Discharge Plan and Services   Discharge Planning Services: CM Consult Post Acute Care Choice: IP Rehab (Patient requesting to go somewhere close to his home, lives in [place], [place]) Living arrangements for the past 2 months: Single Family Home                                       Social Drivers of Health (SDOH) Interventions SDOH Screenings   Food Insecurity: Patient Declined (07/27/2024)  Housing: Patient Declined (07/27/2024)  Transportation Needs: Patient Declined (07/27/2024)  Utilities: Patient Declined (07/27/2024)  Financial Resource Strain: Medium Risk (08/08/2023)   Received from [facility]  Social Connections: Unknown (07/27/2024)  Tobacco Use: Medium Risk (07/27/2024)    Readmission Risk Interventions     No data to display

## 2024-07-30 NOTE — Progress Notes (Signed)
 NEUROLOGY CONSULT FOLLOW UP NOTE   Date of service: July 29, 2024 Patient Name: Joseph Clements MRN:  [id number] DOB:  1941-08-08  Interval Hx/subjective   - Repeat brain overnight stable w/o e/o new ischemia - Patient's speech significantly improved today  Vitals   Vitals:   07/29/24 1736 07/29/24 2136 07/30/24 0136 07/30/24 0536  BP: (!) 164/87 (!) 187/92 (!) 216/97 (!) 194/98  Pulse: 90 95 77 86  Resp: 16 16  20   Temp: 97.8 F (36.6 C) 97.8 F (36.6 C) 97.8 F (36.6 C) (!) 97.4 F (36.3 C)  TempSrc: Oral Oral Oral Oral  SpO2: 98% 98% 98% 98%  Weight:      Height:         Body mass index is 28.19 kg/m.  Physical Exam   Constitutional: Appears well-developed and well-nourished.   Neurologic Examination      Neuro: Mental Status: Patient is awake, alert, oriented to person, place, month, year, and situation. Patient is able to give a clear and coherent history. No signs of aphasia or neglect Speech: moderate dysarthria. Mild-to-moderate word-finding difficulty, new from yesterday Cranial Nerves: II: Visual Fields are full. Pupils are equal, round, and reactive to light.   III,IV, VI: EOMI without ptosis or diploplia.  V: Facial sensation is symmetric to temperature VII: Facial movement with right facial weakness VIII: hearing is intact to voice X: Uvula elevates symmetrically XII: tongue is midline without atrophy or fasciculations.  Motor: Tone is normal. Bulk is normal. 5/5 strength was present on the left, he has 4/5 strength in the right arm and 4+/5 strength in the right leg Sensory: Sensation is symmetric to light touch n the arms and legs. Cerebellar: FNF consistent with weakness on the right, also likely some underlying intentional tremor      Medications  Current Facility-Administered Medications:    acetaminophen  (TYLENOL ) tablet 650 mg, 650 mg, Oral, Q4H PRN **OR** acetaminophen  (TYLENOL ) 160 MG/5ML solution 650 mg, 650 mg, Per Tube, Q4H  PRN **OR** acetaminophen  (TYLENOL ) suppository 650 mg, 650 mg, Rectal, Q4H PRN, Laurita Manor T, MD   aspirin  EC tablet 81 mg, 81 mg, Oral, Daily, Matthews Barnacle M, MD, 81 mg at 07/29/24 1017   atorvastatin  (LIPITOR ) tablet 80 mg, 80 mg, Oral, Daily, Matthews Barnacle HERO, MD, 80 mg at 07/29/24 9042   buPROPion  (WELLBUTRIN  XL) 24 hr tablet 300 mg, 300 mg, Oral, Daily, Zhang, Ping T, MD, 300 mg at 07/29/24 9041   clopidogrel  (PLAVIX ) tablet 75 mg, 75 mg, Oral, Daily, Matthews Barnacle HERO, MD, 75 mg at 07/29/24 1017   docusate sodium  (COLACE) capsule 100 mg, 100 mg, Oral, BID, Zhang, Ping T, MD, 100 mg at 07/29/24 2140   hydrALAZINE  (APRESOLINE ) injection 5 mg, 5 mg, Intravenous, Q6H PRN, Laurita Manor T, MD, 5 mg at 07/30/24 0200   insulin  aspart (novoLOG ) injection 0-9 Units, 0-9 Units, Subcutaneous, TID WC, Laurita Manor DASEN, MD, 2 Units at 07/29/24 1755   linagliptin  (TRADJENTA ) tablet 5 mg, 5 mg, Oral, Daily, Laurita, Ping T, MD, 5 mg at 07/29/24 9041   LORazepam  (ATIVAN ) injection 0.5 mg, 0.5 mg, Intravenous, Q6H PRN, Laurita Manor T, MD   pantoprazole  (PROTONIX ) EC tablet 40 mg, 40 mg, Oral, Daily, Laurita Manor T, MD, 40 mg at 07/29/24 9041   senna-docusate (Senokot-S) tablet 2 tablet, 2 tablet, Oral, BID, Laurita Pillion, MD, 2 tablet at 07/29/24 2140  Labs and Diagnostic Imaging   CBC:  Recent Labs  Lab 07/27/24 0810  WBC 7.1  NEUTROABS 4.9  HGB 15.0  HCT 44.5  MCV 84.9  PLT 253    Basic Metabolic Panel:  Lab Results  Component Value Date   NA 142 07/29/2024   K 4.1 07/29/2024   CO2 24 07/29/2024   GLUCOSE 167 (H) 07/29/2024   BUN 24 (H) 07/29/2024   CREATININE 1.36 (H) 07/29/2024   CALCIUM 9.1 07/29/2024   GFRNONAA 51 (L) 07/29/2024   GFRAA 59 (L) 05/28/2018   Lipid Panel:  Lab Results  Component Value Date   LDLCALC 78 07/28/2024   HgbA1c:  Lab Results  Component Value Date   HGBA1C 8.4 (H) 07/27/2024   Urine Drug Screen: No results found for: LABOPIA, COCAINSCRNUR, LABBENZ,  AMPHETMU, THCU, LABBARB  Alcohol Level No results found for: [facility] INR  Lab Results  Component Value Date   INR 0.9 07/27/2024   APTT  Lab Results  Component Value Date   APTT 22 (L) 07/27/2024   AED levels: No results found for: PHENYTOIN, ZONISAMIDE, LAMOTRIGINE, LEVETIRACETA  All CNS imaging personally reviewed  CTA H&N 1. Ovoid area of diminished attenuation within the left basal ganglia and periventricular white matter, concerning for acute/subacute infarct. Recommend correlation with MRI of the brain. 2. 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 No large vessel occlusion or flow-limiting stenosis.   MRI brain wo contrast 1. 2.5 cm acute to early subacute ischemic nonhemorrhagic left basal ganglia infarct. 2. Small symmetric bilateral subdural collections overlying the cerebral convexities bilaterally, measuring up to 3 mm in maximal diameter without mass effect. Upon review of prior CT. These appear minimally dense in nature, and likely reflect small subacute subdural hematomas, although mildly complex hygromas could also have this appearance. 3. Underlying age-related cerebral atrophy with mild chronic small vessel ischemic disease.  TTE - no intracardiac clot or other significant abnl, neg bubble  Stroke Labs     Component Value Date/Time   CHOL 142 07/28/2024 0602   TRIG 130 07/28/2024 0602   HDL 38 (L) 07/28/2024 0602   CHOLHDL 3.7 07/28/2024 0602   VLDL 26 07/28/2024 0602   LDLCALC 78 07/28/2024 0602    Lab Results  Component Value Date/Time   HGBA1C 8.4 (H) 07/27/2024 12:54 PM  .   Assessment   A/P: 83 y.o. 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 Etiology of stroke favored to be small vessel disease. He was also noted to have bilateral subdural fluid  collections. Neurosurgery was consulted for the bilateral subdural fluid collections and recommended no acute intervention at this time, follow up in neurosurgery clinic outpatient. On my examination yesterday his dysarthria was significantly worse than that documented by Dr. Michaela in his initial neurology consult note yesterday. In addition he has some new mild word-finding difficulty. I ordered repeat MRI brain but this shows no new ischemia. Due to his fluctuating exam, recommend EEG to r/o focal seizure.  Recommendations   - rEEG - ASA 81mg  daily + plavix 75mg  daily x21 days f/b ASA 81mg  daily monotherapy after that - D/w Dr. Penne Sharps, neurosurgery OK with DAPT in the setting of his bilateral subdural fluid collections - D/c crestor and start atorvastatin 80mg  daily (LDL is slightly above goal at 78) - PT/OT/SLP - Patient currently being considered for CIR - Tele - Risk factor modification esp glycemic control - Ambulatory referral to neurology  for outpatient f/u at hospital discharge  Will continue to follow ______________________________________________________________________   Signed, Elida CHRISTELLA Ross, MD [facility]

## 2024-07-30 NOTE — Plan of Care (Signed)
 Neurology plan of care  Please see neurology progress note from yesterday. His speech was significantly improved yesterday on exam. rEEG performed yesterday was normal.   Inpatient stroke workup is now complete. Please see yesterday's neurology progress note for final recs regarding medications, risk factor modification, and outpatient f/u.  Neurology will be available prn for questions going forward.  Elida Ross, MD [facility] [phone]  If 7pm- 7am, please page neurology on call as listed in [facility]ION.

## 2024-07-31 DIAGNOSIS — I639 Cerebral infarction, unspecified: Secondary | ICD-10-CM | POA: Diagnosis not present

## 2024-07-31 LAB — CBC
HCT: 43.6 % (ref 39.0–52.0)
Hemoglobin: 14.5 g/dL (ref 13.0–17.0)
MCH: 28.5 pg (ref 26.0–34.0)
MCHC: 33.3 g/dL (ref 30.0–36.0)
MCV: 85.7 fL (ref 80.0–100.0)
Platelets: 261 K/uL (ref 150–400)
RBC: 5.09 MIL/uL (ref 4.22–5.81)
RDW: 13.1 % (ref 11.5–15.5)
WBC: 6.5 K/uL (ref 4.0–10.5)
nRBC: 0 % (ref 0.0–0.2)

## 2024-07-31 LAB — BASIC METABOLIC PANEL WITH GFR
Anion gap: 11 (ref 5–15)
BUN: 35 mg/dL — ABNORMAL HIGH (ref 8–23)
CO2: 24 mmol/L (ref 22–32)
Calcium: 9.3 mg/dL (ref 8.9–10.3)
Chloride: 103 mmol/L (ref 98–111)
Creatinine, Ser: 1.48 mg/dL — ABNORMAL HIGH (ref 0.61–1.24)
GFR, Estimated: 46 mL/min — ABNORMAL LOW (ref >60)
Glucose, Bld: 145 mg/dL — ABNORMAL HIGH (ref 70–99)
Potassium: 4.5 mmol/L (ref 3.5–5.1)
Sodium: 138 mmol/L (ref 135–145)

## 2024-07-31 LAB — PHOSPHORUS: Phosphorus: 4.5 mg/dL (ref 2.5–4.6)

## 2024-07-31 LAB — GLUCOSE, CAPILLARY
Glucose-Capillary: 173 mg/dL — ABNORMAL HIGH (ref 70–99)
Glucose-Capillary: 167 mg/dL — ABNORMAL HIGH (ref 70–99)
Glucose-Capillary: 171 mg/dL — ABNORMAL HIGH (ref 70–99)
Glucose-Capillary: 147 mg/dL — ABNORMAL HIGH (ref 70–99)
Glucose-Capillary: 168 mg/dL — ABNORMAL HIGH (ref 70–99)

## 2024-07-31 LAB — MAGNESIUM: Magnesium: 2.3 mg/dL (ref 1.7–2.4)

## 2024-07-31 MED ORDER — VITAMIN D (ERGOCALCIFEROL) 1.25 MG (50000 UNIT) PO CAPS
50000.0000 [IU] | ORAL_CAPSULE | ORAL | Status: DC
  Administered 2024-07-31: 50000 [IU] via ORAL
  Filled 2024-07-31: qty 1

## 2024-07-31 MED ORDER — CYANOCOBALAMIN 1000 MCG/ML IJ SOLN
1000.0000 ug | Freq: Every day | INTRAMUSCULAR | Status: DC
  Administered 2024-07-31 – 2024-08-01 (×2): 1000 ug via INTRAMUSCULAR
  Filled 2024-07-31 (×2): qty 1

## 2024-07-31 MED ORDER — VITAMIN B-12 1000 MCG PO TABS: 1000.0000 ug | ORAL_TABLET | Freq: Every day | ORAL | Status: DC

## 2024-07-31 NOTE — Plan of Care (Signed)

## 2024-07-31 NOTE — Progress Notes (Signed)
 Mobility Specialist - Progress Note   07/31/24 1600  Mobility  Activity Ambulated with assistance;Stood at bedside  Level of Assistance Contact guard assist, steadying assist  Assistive Device Front wheel walker  Distance Ambulated (ft) 10 ft  Range of Motion/Exercises Active;All extremities  Activity Response Tolerated well  Mobility visit 1 Mobility  Mobility Specialist Start Time (ACUTE ONLY) 1604  Mobility Specialist Stop Time (ACUTE ONLY) 1619  Mobility Specialist Time Calculation (min) (ACUTE ONLY) 15 min   Pt was supine in bed with the HOB elevated. Pt agreed to mobility. Pt has R UE weakness. Pt has U R LE weakness. Pt is able today to get to the EOB with minA and bed features. Pt is able to STS today with minA. Pt ambulated well. At the beginning of ambulation pt had a BM. Pt returned back to the bed with needs in reach and bed alarm on.   Clem Rodes Mobility Specialist 07/31/24, 4:37 PM

## 2024-07-31 NOTE — Progress Notes (Signed)
 Occupational Therapy Treatment Patient Details Name: Joseph Clements MRN: [id number] DOB: 02/01/1941 Today's Date: 07/31/2024   History of present illness Joseph Clements is a 83 y.o. male with medical history significant of HTN, DM, anxiety/depression, BPH, presented with slurred speech. 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   OT comments  Pt is supine in bed on arrival. Pleasant and agreeable to OT session. He denies pain. Pt performed bed mobility with Min A to exit R side of bed. Speech continues to improve, as well as RUE ROM and strength as well as functional use of R hand. However, he still required Min A to stand from EOB and ambulate ~15 ft to bathroom with cues for safety and RW management assist, as well as cues for upright posture d/t R lateral lean. Toileting tasks performed with Max A including LB bathing in standing in front of toilet with BUE support on RW to maintain standing balance. Pt performed standing hand hygiene at sink with constant CGA/Min A and unilateral support on sink to maintain balance. Unable to perform tasks without UE support d/t instability. He is highly motivated and a great candidate for CIR to maximize RUE functional use/strength and return to PLOF. VSS at end of session. Pt returned to bed with all needs in place and will cont to require skilled acute OT services to maximize his safety and IND to return to PLOF.       If plan is discharge home, recommend the following:  A little help with walking and/or transfers;A little help with bathing/dressing/bathroom;Assistance with cooking/housework;Assist for transportation;Help with stairs or ramp for entrance;Assistance with feeding;Direct supervision/assist for medications management;Supervision due to cognitive status;Direct supervision/assist for financial management   Equipment Recommendations  Other (comment) (defer to next venue)    Recommendations for  Other Services      Precautions / Restrictions Precautions Precautions: Fall Recall of Precautions/Restrictions: Intact Restrictions Weight Bearing Restrictions Per Provider Order: No       Mobility Bed Mobility Overal bed mobility: Needs Assistance Bed Mobility: Supine to Sit, Sit to Supine     Supine to sit: Min assist Sit to supine: Min assist        Transfers Overall transfer level: Needs assistance Equipment used: Rolling walker (2 wheels) Transfers: Sit to/from Stand Sit to Stand: Contact guard assist, Min assist           General transfer comment: to stand from bed and toilet during session, cues for safety, hand placement and sequencing/technique; shuffling gait pattern     Balance Overall balance assessment: History of Falls, Needs assistance Sitting-balance support: Feet supported Sitting balance-Leahy Scale: Good     Standing balance support: Bilateral upper extremity supported, During functional activity, Reliant on assistive device for balance, Single extremity supported Standing balance-Leahy Scale: Poor Standing balance comment: requires Min A to constant CGA during dynamic standing balance activities d/t R lateral lean                           ADL either performed or assessed with clinical judgement   ADL Overall ADL's : Needs assistance/impaired     Grooming: Wash/dry hands;Minimal assistance;Standing Grooming Details (indicate cue type and reason): at sink, using LUE to maintain stability at sink, noted with R lateral lean requiring Min A to correct while standing     Lower Body Bathing: Maximal assistance;Sit to/from stand Lower Body Bathing Details (indicate  cue type and reason): after incont BM and urination walking to toilet     Lower Body Dressing: Sitting/lateral leans;Supervision/safety Lower Body Dressing Details (indicate cue type and reason): to donn slip on shoes Toilet Transfer: Minimal assistance;Rolling walker (2  wheels);Cueing for sequencing;Regular Toilet;Grab bars   Toileting- Clothing Manipulation and Hygiene: Maximal assistance;Sit to/from stand Toileting - Clothing Manipulation Details (indicate cue type and reason): after incont BM and urination     Functional mobility during ADLs: Rolling walker (2 wheels);Cueing for sequencing;Minimal assistance;Contact guard assist General ADL Comments: CGA with occasional Min A d/t R lateral lean ~15 ft to bathroom and back to bed    Extremity/Trunk Assessment              Vision       Perception     Praxis     Communication Communication Factors Affecting Communication: Reduced clarity of speech;Difficulty expressing self   Cognition Arousal: Alert Behavior During Therapy: WFL for tasks assessed/performed                                          Cueing      Exercises Other Exercises Other Exercises: RUE ROM WFL, improved strength 4-/5 and improved use of R hand during toileting hygiene    Shoulder Instructions       General Comments increased SOB/DOE after activity, but sp02 96% on RA and HR 85    Pertinent Vitals/ Pain       Pain Assessment Pain Assessment: No/denies pain  Home Living                                          Prior Functioning/Environment              Frequency  Min 3X/week        Progress Toward Goals  OT Goals(current goals can now be found in the care plan section)  Progress towards OT goals: Progressing toward goals  Acute Rehab OT Goals Patient Stated Goal: get better OT Goal Formulation: With patient Time For Goal Achievement: 08/11/24 Potential to Achieve Goals: Good  Plan      Co-evaluation                 AM-PAC OT 6 Clicks Daily Activity     Outcome Measure   Help from another person eating meals?: A Little Help from another person taking care of personal grooming?: A Little Help from another person toileting, which includes  using toliet, bedpan, or urinal?: A Little Help from another person bathing (including washing, rinsing, drying)?: A Lot Help from another person to put on and taking off regular upper body clothing?: A Little Help from another person to put on and taking off regular lower body clothing?: A Lot 6 Click Score: 16    End of Session Equipment Utilized During Treatment: Rolling walker (2 wheels);Gait belt  OT Visit Diagnosis: Hemiplegia and hemiparesis;Other symptoms and signs involving cognitive function;Feeding difficulties (R63.3);Cognitive communication deficit (R41.841) Symptoms and signs involving cognitive functions: Cerebral infarction Hemiplegia - Right/Left: Right Hemiplegia - dominant/non-dominant: Dominant Hemiplegia - caused by: Cerebral infarction   Activity Tolerance Patient tolerated treatment well   Patient Left with call bell/phone within reach;in bed;with bed alarm set   Nurse Communication Mobility status  Time: 1520-1550 OT Time Calculation (min): 30 min  Charges: OT General Charges $OT Visit: 1 Visit OT Treatments $Self Care/Home Management : 23-37 mins  Ali Mohl Chrismon, OTR/L  07/31/24, 4:53 PM   Jania Steinke E Chrismon 07/31/2024, 4:49 PM

## 2024-07-31 NOTE — Progress Notes (Signed)
 Progress Note   Patient: Joseph Clements FMW:[id number] DOB: March 31, 1941 DOA: 07/27/2024     4 DOS: the patient was seen and examined on 07/31/2024   Brief hospital course: Joseph Clements is a 83 y.o. male with medical history significant of HTN, DM, anxiety/depression, BPH, presented with slurred speech. 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 Patient is seen by neurosurgery, okay to start dual antiplatelet treatment.   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 # Small subdural hematoma. Appreciate neurology and neurosurgery consult,  continue statin and dual antiplatelet treatment for 21 days followed by baby aspirin  81 mg p.o. daily.   Patient has been seen by PT/OT, recommended acute rehab. Patient had a worsening dysarthria on 12/1, repeated CT scan and MRI did not show worsening stroke or bleeding.   EEG negative for seizures Echo: EF 60-65%, grade 1 diastolic dysfunction.  No valvular disease.  No LA dilation.  No PFO.  Currently patient is stable to discharge, awaiting for placement.    # Chronic kidney disease stage IIIa. sCr 1.48 slightly elevated could be secondary to dehydration prerenal, Patient was encouraged for oral fluid intake. Discussed IV fluid for patient will try to drink more water today.  # Hypokalemia; Resolved, Potassium normalized    # Essential hypertension. 12/3 labetalol  10 mg x 1 dose given Started Lopressor  25 mg p.o. twice daily and losartan  50 mg p.o. daily Monitor BP and titrate medications according  # NIDDM T2 At home patient was on glipizide and Tradjenta  Continue  Tradjenta  Started NovoLog  sliding scale during hospital stay Continue diabetic diet, monitor CBG  # Vitamin B12 level 213, goal >400: Started vitamin B12 1000 mcg IM injection daily during hospital stay, followed by oral supplement.  Follow-up PCP to repeat vitamin B12 level after 3 to 6 months.  # Vitamin D  level 30.7, at borderline: started vitamin D  50,000 units p.o. weekly to prevent deficiency. follow with PCP to repeat vitamin D  level after 3 to 6 months.   Body mass index is 28.19 kg/m.   Subjective:  No significant events overnight. Patient still has dysarthria and generalized weakness, no any other active issues Patient is aware that he is waiting for SNF placement.   Physical Exam: Vitals:   07/31/24 0035 07/31/24 0515 07/31/24 0726 07/31/24 1236  BP: 135/69 (!) 146/76 (!) 142/72 135/74  Pulse: 73 70 66 73  Resp:  18 19 17   Temp: (!) 97.3 F (36.3 C) 97.6 F (36.4 C) 97.6 F (36.4 C) 98.1 F (36.7 C)  TempSrc: Oral Oral Oral Oral  SpO2: 98% 97% 95% 97%  Weight:      Height:       General exam: Appears calm and comfortable  Respiratory system: Clear to auscultation. Respiratory effort normal. Cardiovascular system: S1 & S2 heard, RRR. No JVD, murmurs, rubs, gallops or clicks. No pedal edema. Gastrointestinal system: Abdomen is nondistended, soft and nontender. No organomegaly or masses felt. Normal bowel sounds heard. Central nervous system: Alert and oriented x3.  Slurred speech and right facial droop Extremities: No edema Skin: No rashes, lesions or  ulcers Psychiatry: Judgement and insight appear normal. Mood & affect appropriate.    Data Reviewed:  Reviewed MRI and CT scan.  Family Communication: None  Disposition: Status is: Inpatient Remains inpatient appropriate because: Severity of disease, pending placement.     Time spent: 35 minutes  Author: Elvan Sor, MD 07/31/2024 2:10 PM  For on call review [email or web address].

## 2024-07-31 NOTE — TOC Progression Note (Addendum)
 Transition of Care [facility]) - Progression Note    Patient Details  Name: Joseph Clements MRN: [id number] Date of Birth: 1941/01/21  [facility] CM/SW Contact  Dalia GORMAN Fuse, RN Phone Number: 07/31/2024, 11:05 AM  Clinical Narrative:     [facility] in [place] is considering. Patient has bed offers from [facility] & [facility]. [facility] declined to offer.  DC disposition is [facility] IPR vs SNF.  11:13: [facility] can offer a bed. Patient verbalized to therapy that he is willing to accept IR bed outside of CIR. TOC spoke with the patient's wife and she is in agreement with [facility]. She wants to    [facility] ins shara is pending.   TOC will continue to follow.   Expected Discharge Plan: IP Rehab Facility Barriers to Discharge: Continued Medical Work up               Expected Discharge Plan and Services   Discharge Planning Services: CM Consult Post Acute Care Choice: IP Rehab (Patient requesting to go somewhere close to his home, lives in [place], [place]) Living arrangements for the past 2 months: Single Family Home                                       Social Drivers of Health (SDOH) Interventions SDOH Screenings   Food Insecurity: Patient Declined (07/27/2024)  Housing: Patient Declined (07/27/2024)  Transportation Needs: Patient Declined (07/27/2024)  Utilities: Patient Declined (07/27/2024)  Financial Resource Strain: Medium Risk (08/08/2023)   Received from [facility]  Social Connections: Unknown (07/27/2024)  Tobacco Use: Medium Risk (07/27/2024)    Readmission Risk Interventions     No data to display

## 2024-08-01 DIAGNOSIS — I639 Cerebral infarction, unspecified: Secondary | ICD-10-CM | POA: Diagnosis not present

## 2024-08-01 LAB — BASIC METABOLIC PANEL WITH GFR
Anion gap: 14 (ref 5–15)
BUN: 37 mg/dL — ABNORMAL HIGH (ref 8–23)
CO2: 23 mmol/L (ref 22–32)
Calcium: 9.5 mg/dL (ref 8.9–10.3)
Chloride: 101 mmol/L (ref 98–111)
Creatinine, Ser: 1.59 mg/dL — ABNORMAL HIGH (ref 0.61–1.24)
GFR, Estimated: 43 mL/min — ABNORMAL LOW (ref >60)
Glucose, Bld: 194 mg/dL — ABNORMAL HIGH (ref 70–99)
Potassium: 4.7 mmol/L (ref 3.5–5.1)
Sodium: 137 mmol/L (ref 135–145)

## 2024-08-01 LAB — GLUCOSE, CAPILLARY
Glucose-Capillary: 183 mg/dL — ABNORMAL HIGH (ref 70–99)
Glucose-Capillary: 163 mg/dL — ABNORMAL HIGH (ref 70–99)
Glucose-Capillary: 202 mg/dL — ABNORMAL HIGH (ref 70–99)

## 2024-08-01 MED ORDER — METOPROLOL TARTRATE 25 MG PO TABS: 25.0000 mg | ORAL_TABLET | Freq: Two times a day (BID) | ORAL | Status: AC

## 2024-08-01 MED ORDER — CYANOCOBALAMIN 1000 MCG/ML IJ SOLN: 1000.0000 ug | Freq: Every day | INTRAMUSCULAR | Status: AC

## 2024-08-01 MED ORDER — TRADJENTA 5 MG PO TABS: 5.0000 mg | ORAL_TABLET | Freq: Every day | ORAL | Status: AC

## 2024-08-01 MED ORDER — VITAMIN D (ERGOCALCIFEROL) 1.25 MG (50000 UNIT) PO CAPS: 50000.0000 [IU] | ORAL_CAPSULE | ORAL | Status: AC

## 2024-08-01 MED ORDER — ATORVASTATIN CALCIUM 80 MG PO TABS: 80.0000 mg | ORAL_TABLET | Freq: Every day | ORAL | Status: AC

## 2024-08-01 MED ORDER — POLYETHYLENE GLYCOL 3350 17 G PO PACK: 17.0000 g | PACK | Freq: Two times a day (BID) | ORAL | Status: AC

## 2024-08-01 MED ORDER — SODIUM CHLORIDE 0.9 % IV SOLN: INTRAVENOUS | Status: DC

## 2024-08-01 MED ORDER — ASPIRIN 81 MG PO TBEC: 81.0000 mg | DELAYED_RELEASE_TABLET | Freq: Every day | ORAL | Status: AC

## 2024-08-01 MED ORDER — CLOPIDOGREL BISULFATE 75 MG PO TABS: 75.0000 mg | ORAL_TABLET | Freq: Every day | ORAL | Status: AC

## 2024-08-01 MED ORDER — AMLODIPINE BESYLATE 5 MG PO TABS: 5.0000 mg | ORAL_TABLET | Freq: Every day | ORAL | Status: DC

## 2024-08-01 MED ORDER — AMLODIPINE BESYLATE 5 MG PO TABS: 5.0000 mg | ORAL_TABLET | Freq: Every day | ORAL | Status: AC

## 2024-08-01 MED ORDER — CYANOCOBALAMIN 1000 MCG PO TABS: 1000.0000 ug | ORAL_TABLET | Freq: Every day | ORAL | Status: AC

## 2024-08-01 MED ORDER — LOSARTAN POTASSIUM 50 MG PO TABS: 50.0000 mg | ORAL_TABLET | Freq: Every day | ORAL | Status: AC

## 2024-08-01 NOTE — Discharge Summary (Signed)
 [facility] Discharge Summary   Patient: Joseph Clements FMW:[id number]  PCP: Glover Lenis, MD  Date of admission: 07/27/2024   Date of discharge:  08/01/2024     Discharge Diagnoses:  Principal Problem:   Stroke (cerebrum) [facility]) Active Problems:   Overweight (BMI 25.0-29.9)   Essential hypertension   CKD stage 3a, GFR 45-59 ml/min (HCC)   Hypokalemia   Subdural hematoma, chronic (HCC)   Right sided weakness   Dysarthria   Facial droop   Hemorrhagic stroke [facility])   Admitted From: Home Disposition: Acute inpatient rehab  Recommendations for Outpatient Follow-up:  PCP: Follow-up with PCP, patient should be seen by an MD in 1 to 2 days.  Monitor BP and titrate medications accordingly. Follow-up with neurology in 1 to 2 weeks Follow-up with neurosurgery in 1 to 2 weeks Repeat vitamin B12 level and vitamin D  level in between 3 to 6 months Follow up LABS/TEST:  As above   Follow-up Information     Glover Lenis, MD Follow up.   Specialty: Family Medicine Why: hospital follow up Contact information: [address] [place] [address] [phone]                Diet recommendation: Cardiac diet  Activity: The patient is advised to gradually reintroduce usual activities, as tolerated  Discharge Condition: stable  Code Status: Full code   History of present illness: As per the H and P dictated on admission.  Hospital Course:  Joseph Clements is a 83 y.o. male with medical history significant of HTN, DM, anxiety/depression, BPH, presented with slurred speech. 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 Patient is seen by neurosurgery, okay to start dual antiplatelet treatment   Assessment and Plan:   # Left basal ganglia and ischemic stroke. # Small subdural hematoma. Appreciate neurology and neurosurgery consult,  continue statin and dual antiplatelet treatment for 21 days followed by baby aspirin   81 mg p.o. daily.   Patient has been seen by PT/OT, recommended acute rehab. Patient had a worsening dysarthria on 12/1, repeated CT scan and MRI did not show worsening stroke or bleeding.   EEG negative for seizures Echo: EF 60-65%, grade 1 diastolic dysfunction.  No valvular disease.  No LA dilation.  No PFO.  Currently patient is stable to discharge to acute inpatient rehab  # Chronic kidney disease stage IIIa. sCr 1.48 slightly elevated could be secondary to dehydration prerenal, Patient was encouraged for oral fluid intake. Discussed IV fluid for patient will try to drink more water today. 12/5 sCr 1.59 elevated, started NS 100 mL/h for overnight hydration. Insurance Auth approved today so patient is being discharged, continue oral hydration and repeat BMP in 2 to 3 days.   # Hypokalemia; Resolved, Potassium normalized  # Essential hypertension. 12/3 labetalol  10 mg x 1 dose given Started Lopressor  25 mg p.o. twice daily and losartan  50 mg p.o. daily.  Started amlodipine  5 mg nightly if systolic BP greater than 140 mmHg. Monitor BP and titrate medications according   # NIDDM T2 At home patient was on glipizide and Tradjenta  Continue Tradjenta  and resumed glipizide at discharge S/p NovoLog  sliding scale during hospital stay Continue diabetic diet, monitor CBG   # Vitamin B12 level 213, goal >400: Started vitamin B12 1000 mcg IM injection daily during hospital stay, followed by oral supplement.  Follow-up PCP to repeat vitamin B12 level after 3 to 6 months.   #  Vitamin D  level 30.7, at borderline: started vitamin D  50,000 units p.o. weekly to prevent deficiency. follow with PCP to repeat vitamin D  level after 3 to 6 months.    Body mass index is 28.19 kg/m.  Nutrition Interventions:  - Patient was instructed, not to drive, operate heavy machinery, perform activities at heights, swimming or participation in water activities or provide baby sitting services while on Pain, Sleep  and Anxiety Medications; until his outpatient Physician has advised to do so again.  - Also recommended to not to take more than prescribed Pain, Sleep and Anxiety Medications.  Patient was seen by physical therapy, who recommended Therapy, acute inpatient rehab which was arranged. On the day of the discharge the patient's vitals were stable, and no other acute medical condition were reported by patient. the patient was felt safe to be discharge at acute inpatient rehab with Therapy.  Consultants: Neurology and neurosurgery Procedures: None  Discharge Exam: General: Appear in no distress, Oral Mucosa Clear, moist. Cardiovascular: S1 and S2 Present, no Murmur, Respiratory: normal respiratory effort, Bilateral Air entry present and no Crackles, no wheezes Abdomen: Bowel Sound present, Soft and no tenderness. Extremities: no Pedal edema, no calf tenderness Neurology: Dysarthria and right facial droop, no any other focal deficits affect appropriate.  Filed Weights   07/27/24 0811  Weight: 81.6 kg   Vitals:   08/01/24 1154 08/01/24 1200  BP: 121/70   Pulse: 72   Resp: 17 17  Temp: 98.6 F (37 C)   SpO2: 95% 95%    DISCHARGE MEDICATION: Allergies as of 08/01/2024       Reactions   Shellfish Allergy Nausea And Vomiting   NO PROBLEM WITH BETADINE         Medication List     STOP taking these medications    rosuvastatin  20 MG tablet Commonly known as: CRESTOR    VITAMIN B-12 PO Replaced by: cyanocobalamin  1000 MCG/ML injection       TAKE these medications    amLODipine  5 MG tablet Commonly known as: NORVASC  Take 1 tablet (5 mg total) by mouth at bedtime. Hold if SBP <140   aspirin  EC 81 MG tablet Take 1 tablet (81 mg total) by mouth daily. Swallow whole. Start taking on: August 02, 2024   atorvastatin  80 MG tablet Commonly known as: LIPITOR  Take 1 tablet (80 mg total) by mouth daily. Start taking on: August 02, 2024   buPROPion  300 MG 24 hr  tablet Commonly known as: WELLBUTRIN  XL Take 1 tablet by mouth daily.   clopidogrel  75 MG tablet Commonly known as: PLAVIX  Take 1 tablet (75 mg total) by mouth daily for 17 days. Start taking on: August 02, 2024   cyanocobalamin  1000 MCG/ML injection Commonly known as: VITAMIN B12 Inject 1 mL (1,000 mcg total) into the muscle daily at 12 noon. Start taking on: August 02, 2024 Replaces: VITAMIN B-12 PO   cyanocobalamin  1000 MCG tablet Take 1 tablet (1,000 mcg total) by mouth daily. Start taking on: August 07, 2024   glipiZIDE 5 MG 24 hr tablet Commonly known as: GLUCOTROL XL Take 10 mg by mouth daily.   losartan  50 MG tablet Commonly known as: COZAAR  Take 1 tablet (50 mg total) by mouth daily. Hold if SBP <130 Start taking on: August 02, 2024   Magnesium 250 MG Tabs Take 250 mg by mouth at bedtime.   metoprolol  tartrate 25 MG tablet Commonly known as: LOPRESSOR  Take 1 tablet (25 mg total) by mouth 2 (two) times daily.  Hold if SBP <110 mmHg and or HR <65   pantoprazole  40 MG tablet Commonly known as: PROTONIX  Take 40 mg by mouth daily.   polyethylene glycol 17 g packet Commonly known as: MIRALAX  / GLYCOLAX  Take 17 g by mouth 2 (two) times daily.   Tradjenta  5 MG Tabs tablet Generic drug: linagliptin  Take 1 tablet (5 mg total) by mouth daily.   Vitamin D  (Ergocalciferol ) 1.25 MG (50000 UNIT) Caps capsule Commonly known as: DRISDOL  Take 1 capsule (50,000 Units total) by mouth every 7 (seven) days. Start taking on: August 07, 2024       Allergies  Allergen Reactions   Shellfish Allergy Nausea And Vomiting    NO PROBLEM WITH BETADINE    Discharge Instructions     Call MD for:   Complete by: As directed    Weakness or numbness, any new neurological changes.   Call MD for:  difficulty breathing, headache or visual disturbances   Complete by: As directed    Call MD for:  extreme fatigue   Complete by: As directed    Call MD for:  persistant dizziness  or light-headedness   Complete by: As directed    Call MD for:  persistant nausea and vomiting   Complete by: As directed    Call MD for:  severe uncontrolled pain   Complete by: As directed    Call MD for:  temperature >100.4   Complete by: As directed    Diet - low sodium heart healthy   Complete by: As directed    Discharge instructions   Complete by: As directed    Follow-up with PCP, patient should be seen by an MD in 1 to 2 days.  Monitor BP and titrate medications accordingly. Follow-up with neurology in 1 to 2 weeks Follow-up with neurosurgery in 1 to 2 weeks Repeat vitamin B12 level and vitamin D  level in between 3 to 6 months   Increase activity slowly   Complete by: As directed        The results of significant diagnostics from this hospitalization (including imaging, microbiology, ancillary and laboratory) are listed below for reference.    Significant Diagnostic Studies: EEG adult Result Date: 07/29/2024 Matthews Elida HERO, MD     07/30/2024  2:52 PM Routine EEG Report Joseph Clements is a 83 y.o. male with a history of altered mental status who is undergoing an EEG to evaluate for seizures. Report: This EEG was acquired with electrodes placed according to the International 10-20 electrode system (including Fp1, Fp2, F3, F4, C3, C4, P3, P4, O1, O2, T3, T4, T5, T6, A1, A2, Fz, Cz, Pz). The following electrodes were missing or displaced: none. The occipital dominant rhythm was 9 Hz. This activity is reactive to stimulation. Drowsiness was manifested by background fragmentation; deeper stages of sleep were identified by K complexes and sleep spindles. There was no focal slowing. There were no interictal epileptiform discharges. There were no electrographic seizures identified. Impression: This EEG was obtained while awake and asleep and is normal.   Clinical Correlation: Normal EEGs, however, do not rule out epilepsy. Elida Matthews, MD [facility] [phone] If 7pm-  7am, please page neurology on call as listed in AMION.   MR BRAIN WO CONTRAST Result Date: 07/29/2024 EXAM: MRI BRAIN WITHOUT CONTRAST 07/29/2024 12:45:29 AM TECHNIQUE: Multiplanar multisequence MRI of the head/brain was performed without the administration of intravenous contrast. COMPARISON: None available. CLINICAL HISTORY: Neuro deficit, acute, stroke suspected; worsening exam in patient with known acute  left basal ganglia stroke. Now with severe dysarthria and new word-finding difficulty. Repeat MRI to evaluate for new ischemia. FINDINGS: BRAIN AND VENTRICLES: No acute infarct. Unchanged intermediate-sized subacute infarct of the left basal ganglia. Unchanged appearance of thin bilateral subdural collections measuring up to 2 mm. No mass or midline shift. No hydrocephalus. The sella is unremarkable. Normal flow voids. ORBITS: No acute abnormality. SINUSES AND MASTOIDS: No acute abnormality. BONES AND SOFT TISSUES: Normal marrow signal. No acute soft tissue abnormality. IMPRESSION: 1. No new ischemia. 2. Unchanged intermediate-sized subacute infarct of the left basal ganglia. 3. Unchanged thin bilateral subdural collections measuring up to 2 mm. Electronically signed by: Franky Stanford MD 07/29/2024 03:48 AM EST RP Workstation: [id number]   CT HEAD WO CONTRAST ( ) Result Date: 07/28/2024 EXAM: CT HEAD WITHOUT 07/28/2024 08:27:32 PM TECHNIQUE: CT of the head was performed without the administration of intravenous contrast. Automated exposure control, iterative reconstruction, and/or weight based adjustment of the mA/kV was utilized to reduce the radiation dose to as low as reasonably achievable. COMPARISON: 07/27/2024 head CT and brain MRI. CLINICAL HISTORY: Stroke, follow up; worsening dysarthria and new aphasia in patient with known acute L basal ganglia ischemic infarct and bilateral subdural fluid collections. FINDINGS: BRAIN AND VENTRICLES: No acute intracranial hemorrhage. No mass effect or midline  shift. Subacute left basal ganglia infarct, unchanged. Patchy white matter hypodensities, compatible with chronic microvascular ischemic disease. Known thin subdural collections over both convexities demonstrated on the earlier MRI are not visible by CT. Calcific atherosclerosis. No hydrocephalus. ORBITS: No acute abnormality. SINUSES AND MASTOIDS: No acute abnormality. SOFT TISSUES AND SKULL: No acute skull fracture. No acute soft tissue abnormality. IMPRESSION: 1. Subacute left basal ganglia infarct, unchanged. 2. Known thin subdural collections over both convexities on prior MRI are not visible by CT. 3. Patchy white matter hypodensities, compatible with chronic microvascular ischemic disease. Electronically signed by: Franky Stanford MD 07/28/2024 08:40 PM EST RP Workstation: [id number]   ECHOCARDIOGRAM COMPLETE Result Date: 07/28/2024    ECHOCARDIOGRAM REPORT   Patient Name:   Joseph Clements Date of Exam: 07/28/2024 Medical Rec #:  [id number]       Height:       67.0 in Accession #:    [id number]      Weight:       180.0 lb Date of Birth:  1941-05-18       BSA:          1.934 m Patient Age:    83 years        BP:           177/83 mmHg Patient Gender: M               HR:           88 bpm. Exam Location:  [facility] Procedure: 2D Echo, Cardiac Doppler, Color Doppler and Saline Contrast Bubble            Study (Both Spectral and Color Flow Doppler were utilized during            procedure). Indications:     Stroke I63.9  History:         Patient has no prior history of Echocardiogram examinations.                  Signs/Symptoms:Dyspnea; Risk Factors:Diabetes.  Sonographer:     Christopher Furnace Referring Phys:  [id number] CORT ONEIDA MANA Diagnosing Phys: Deatrice Cage MD IMPRESSIONS  1. Left ventricular ejection fraction, by estimation, is  60 to 65%. The left ventricle has normal function. The left ventricle has no regional wall motion abnormalities. Left ventricular diastolic parameters are consistent with Grade I diastolic  dysfunction (impaired relaxation).  2. Right ventricular systolic function is normal. The right ventricular size is normal. Tricuspid regurgitation signal is inadequate for assessing PA pressure.  3. The mitral valve is normal in structure. No evidence of mitral valve regurgitation. No evidence of mitral stenosis.  4. The aortic valve is normal in structure. Aortic valve regurgitation is not visualized. No aortic stenosis is present.  5. Agitated saline contrast bubble study was negative, with no evidence of any interatrial shunt. FINDINGS  Left Ventricle: Left ventricular ejection fraction, by estimation, is 60 to 65%. The left ventricle has normal function. The left ventricle has no regional wall motion abnormalities. The left ventricular internal cavity size was normal in size. There is  borderline left ventricular hypertrophy. Left ventricular diastolic parameters are consistent with Grade I diastolic dysfunction (impaired relaxation). Right Ventricle: The right ventricular size is normal. No increase in right ventricular wall thickness. Right ventricular systolic function is normal. Tricuspid regurgitation signal is inadequate for assessing PA pressure. Left Atrium: Left atrial size was normal in size. Right Atrium: Right atrial size was normal in size. Pericardium: There is no evidence of pericardial effusion. Mitral Valve: The mitral valve is normal in structure. No evidence of mitral valve regurgitation. No evidence of mitral valve stenosis. MV peak gradient, 11.0 mmHg. The mean mitral valve gradient is 6.0 mmHg. Tricuspid Valve: The tricuspid valve is normal in structure. Tricuspid valve regurgitation is not demonstrated. No evidence of tricuspid stenosis. Aortic Valve: The aortic valve is normal in structure. Aortic valve regurgitation is not visualized. No aortic stenosis is present. Aortic valve mean gradient measures 2.5 mmHg. Aortic valve peak gradient measures 4.1 mmHg. Aortic valve area, by VTI  measures 3.42 cm. Pulmonic Valve: The pulmonic valve was normal in structure. Pulmonic valve regurgitation is not visualized. No evidence of pulmonic stenosis. Aorta: The aortic root is normal in size and structure. Venous: The inferior vena cava was not well visualized. IAS/Shunts: No atrial level shunt detected by color flow Doppler. Agitated saline contrast was given intravenously to evaluate for intracardiac shunting. Agitated saline contrast bubble study was negative, with no evidence of any interatrial shunt.  LEFT VENTRICLE PLAX 2D LVIDd:         4.30 cm   Diastology LVIDs:         2.80 cm   LV e' medial:    8.05 cm/s LV PW:         1.00 cm   LV E/e' medial:  13.4 LV IVS:        1.20 cm   LV e' lateral:   6.85 cm/s LVOT diam:     2.00 cm   LV E/e' lateral: 15.8 LV SV:         58 LV SV Index:   30 LVOT Area:     3.14 cm LV IVRT:       103 msec  RIGHT VENTRICLE RV Basal diam:  4.40 cm    PULMONARY VEINS RV Mid diam:    4.20 cm    Diastolic Velocity: 39.70 cm/s RV S prime:     7.83 cm/s  S/D Velocity:       1.60  Systolic Velocity:  65.20 cm/s LEFT ATRIUM           Index        RIGHT ATRIUM           Index LA diam:      3.50 cm 1.81 cm/m   RA Area:     17.60 cm LA Vol (A2C): 19.6 ml 10.14 ml/m  RA Volume:   44.30 ml  22.91 ml/m LA Vol (A4C): 23.2 ml 12.00 ml/m  AORTIC VALVE AV Area (Vmax):    3.12 cm AV Area (Vmean):   3.08 cm AV Area (VTI):     3.42 cm AV Vmax:           101.80 cm/s AV Vmean:          67.700 cm/s AV VTI:            0.170 m AV Peak Grad:      4.1 mmHg AV Mean Grad:      2.5 mmHg LVOT Vmax:         101.00 cm/s LVOT Vmean:        66.300 cm/s LVOT VTI:          0.185 m LVOT/AV VTI ratio: 1.09  AORTA Ao Root diam: 3.00 cm MITRAL VALVE                TRICUSPID VALVE MV Area (PHT): 7.29 cm     TR Peak grad:   10.2 mmHg MV Area VTI:   2.27 cm     TR Vmax:        160.00 cm/s MV Peak grad:  11.0 mmHg MV Mean grad:  6.0 mmHg     SHUNTS MV Vmax:       1.66 m/s      Systemic VTI:  0.18 m MV Vmean:      113.0 cm/s   Systemic Diam: 2.00 cm MV Decel Time: 104 msec MV E velocity: 108.00 cm/s MV A velocity: 151.00 cm/s MV E/A ratio:  0.72 Deatrice Cage MD Electronically signed by Deatrice Cage MD Signature Date/Time: 07/28/2024/2:04:45 PM    Final    MR BRAIN WO CONTRAST Addendum Date: 07/27/2024 ADDENDUM REPORT: 07/27/2024 21:15 ADDENDUM: Findings discussed with Dr. Lawence by telephone at 9:05 p.m. on 07/27/2024. Electronically Signed   By: Morene Hoard M.D.   On: 07/27/2024 21:15   Result Date: 07/27/2024 CLINICAL DATA:  Follow-up examination for stroke. EXAM: MRI HEAD WITHOUT CONTRAST TECHNIQUE: Multiplanar, multiecho pulse sequences of the brain and surrounding structures were obtained without intravenous contrast. COMPARISON:  Prior CT from earlier the same day. FINDINGS: Brain: Generalized age-related cerebral atrophy. Patchy T2/FLAIR hyperintensity involving the periventricular and deep white matter both cerebral hemispheres, consistent chronic small vessel ischemic disease, mild for age. 2.5 cm focus of restricted diffusion seen involving the left basal ganglia, consistent with an acute to early subacute ischemic infarct. No associated hemorrhage or mass effect. No other evidence for acute or subacute ischemia. Gray-white matter differentiation otherwise maintained. No mass lesion, midline shift, or mass effect. No hydrocephalus. Small symmetric bilateral subdural collections seen overlying the cerebral convexities bilaterally. These measure up to 3 mm in maximal diameter without mass effect. Upon review of prior CT. These appear minimally dense, and likely reflect small subacute subdural hematomas, although mildly complex hygromas could also have this appearance. No significant mass effect. No other acute intracranial hemorrhage. Pituitary gland and suprasellar region within normal limits. Vascular: Major intracranial vascular flow voids are maintained. Skull  and upper cervical spine: Craniocervical junction within normal limits. Bone marrow signal intensity normal. No scalp soft tissue abnormality. Sinuses/Orbits: Prior bilateral ocular lens replacement. Paranasal sinuses are largely clear. No significant mastoid effusion. Other: None. IMPRESSION: 1. 2.5 cm acute to early subacute ischemic nonhemorrhagic left basal ganglia infarct. 2. Small symmetric bilateral subdural collections overlying the cerebral convexities bilaterally, measuring up to 3 mm in maximal diameter without mass effect. Upon review of prior CT. These appear minimally dense in nature, and likely reflect small subacute subdural hematomas, although mildly complex hygromas could also have this appearance. 3. Underlying age-related cerebral atrophy with mild chronic small vessel ischemic disease. Current attempt is being made to contact the covering clinician regarding these findings. Results will be conveyed as soon as possible. Electronically Signed: By: Morene Hoard M.D. On: 07/27/2024 20:47   CT ANGIO HEAD NECK W WO CM Result Date: 07/27/2024 EXAM: CTA Head and Neck with Intravenous Contrast. CT Head without Contrast. CLINICAL HISTORY: Neuro deficit, acute, stroke suspected. TECHNIQUE: Axial CTA images of the head and neck performed with intravenous contrast. MIP reconstructed images were created and reviewed. Axial computed tomography images of the head/brain performed without intravenous contrast. Note: Per PQRS, the description of internal carotid artery percent stenosis, including 0 percent or normal exam, is based on [employer] American Symptomatic Carotid Endarterectomy Trial (NASCET) criteria. Dose reduction technique was used including one or more of the following: automated exposure control, adjustment of mA and kV according to patient size, and/or iterative reconstruction. CONTRAST: With and without. COMPARISON: MRI of the head dated 05/11/2008. FINDINGS: CT HEAD: BRAIN: Ovoid area of  diminished attenuation now present within the left basal ganglia and periventricular white matter, which is concerning for acute/subacute infarct. Age-related volume loss. Mild-to-moderate periventricular white matter disease. No acute intraparenchymal hemorrhage. No mass lesion. No midline shift or extra-axial collection. VENTRICLES: No hydrocephalus. ORBITS: Patient is status post bilateral lens replacement. SINUSES AND MASTOIDS: Mild polypoid mucosal disease within the left maxillary sinus. The remaining paranasal sinuses and mastoid air cells are clear. CTA NECK: COMMON CAROTID ARTERIES: Mild calcific plaque within the carotid bulbs bilaterally, with no significant stenosis. No dissection or occlusion. INTERNAL CAROTID ARTERIES: Calcific plaque along the posterior wall of the proximal left internal carotid artery, with approximately 10% luminal stenosis. The cervical segments of the internal carotid arteries are otherwise unremarkable. No dissection or occlusion. VERTEBRAL ARTERIES: The left vertebral artery is dominant. Both vertebral arteries are normal in caliber throughout their respective courses. No significant stenosis. No dissection or occlusion. CTA HEAD: ANTERIOR CEREBRAL ARTERIES: No significant stenosis. No occlusion. No aneurysm. 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 The M1 segment is mildly irregular in contour but demonstrates no flow-limiting stenosis. There is also mild irregularity of the M1 segment of the right middle cerebral artery. No occlusion. No aneurysm. POSTERIOR CEREBRAL ARTERIES: Moderate stenosis of the P2 segment of the right posterior cerebral artery. No occlusion. No aneurysm. BASILAR ARTERY: No significant stenosis. No occlusion. No aneurysm. OTHER: Mild-to-moderate calcific plaque present within the aortic arch. There is mild calcific atheromatous disease within the carotid siphons with less than 20%  luminal stenosis. There is no evidence of large vessel occlusion or flow-limiting stenosis. SOFT TISSUES: No acute finding. No masses or lymphadenopathy. BONES: No acute osseous abnormality. IMPRESSION: 1. Ovoid area of diminished attenuation within the left basal ganglia and periventricular white matter, concerning for acute/subacute infarct. Recommend correlation with MRI of the brain.  2. 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 No large vessel occlusion or flow-limiting stenosis. Electronically signed by: Evalene Coho MD 07/27/2024 10:35 AM EST RP Workstation: [id number]   CT Cervical Spine Wo Contrast Result Date: 07/27/2024 EXAM: CT CERVICAL SPINE WITHOUT CONTRAST 07/27/2024 09:35:37 AM TECHNIQUE: CT of the cervical spine was performed without the administration of intravenous contrast. Multiplanar reformatted images are provided for review. Automated exposure control, iterative reconstruction, and/or weight based adjustment of the mA/kV was utilized to reduce the radiation dose to as low as reasonably achievable. COMPARISON: None available. CLINICAL HISTORY: Neck trauma (Age >= 65y) FINDINGS: CERVICAL SPINE: BONES AND ALIGNMENT: There is no evidence of fracture or acute traumatic injury. DEGENERATIVE CHANGES: There are mild degenerative changes throughout the cervical spine. SOFT TISSUES: No prevertebral soft tissue swelling. IMPRESSION: 1. No evidence of fracture or acute traumatic injury. 2. Mild degenerative changes throughout the cervical spine. Electronically signed by: Evalene Coho MD 07/27/2024 10:26 AM EST RP Workstation: [id number]    Microbiology: No results found for this or any previous visit (from the past 240 hours).   Labs: CBC: Recent Labs  Lab 07/27/24 0810 07/31/24 0618  WBC 7.1 6.5  NEUTROABS 4.9  --   HGB 15.0 14.5  HCT 44.5 43.6  MCV  84.9 85.7  PLT 253 261   Basic Metabolic Panel: Recent Labs  Lab 07/28/24 0602 07/29/24 0523 07/30/24 1142 07/31/24 0618 08/01/24 1027  NA 141 142 139 138 137  K 3.4* 4.1 4.3 4.5 4.7  CL 106 107 103 103 101  CO2 24 24 23 24 23   GLUCOSE 146* 167* 188* 145* 194*  BUN 24* 24* 25* 35* 37*  CREATININE 1.40* 1.36* 1.35* 1.48* 1.59*  CALCIUM  8.8* 9.1 9.6 9.3 9.5  MG  --  2.2 2.3 2.3  --   PHOS  --   --  4.2 4.5  --    Liver Function Tests: Recent Labs  Lab 07/27/24 0810  AST 34  ALT 49*  ALKPHOS 120  BILITOT 0.7  PROT 7.4  ALBUMIN 4.3   No results for input(s): LIPASE, AMYLASE in the last 168 hours. No results for input(s): AMMONIA in the last 168 hours. Cardiac Enzymes: No results for input(s): CKTOTAL, CKMB, CKMBINDEX, TROPONINI in the last 168 hours. BNP (last 3 results) No results for input(s): BNP in the last 8760 hours. CBG: Recent Labs  Lab 07/31/24 1208 07/31/24 1651 07/31/24 2027 08/01/24 0805 08/01/24 1153  GLUCAP 171* 147* 168* 183* 163*    Time spent: 35 minutes  Signed:  Elvan Sor  [facility] 08/01/2024 3:33 PM

## 2024-08-01 NOTE — TOC Transition Note (Signed)
 Transition of Care [facility]) - Discharge Note   Patient Details  Name: Joseph Clements MRN: [id number] Date of Birth: 07-30-1941  Transition of Care [facility]) CM/SW Contact:  Dalia GORMAN Fuse, RN Phone Number: 08/01/2024, 3:37 PM   Clinical Narrative:     Patient is medically ready to discharge to [facility] for IR. [facility] to transport.  Nurse to call report to [phone]  Final next level of care: IP Rehab Facility Barriers to Discharge: Barriers Resolved   Patient Goals and CMS Choice     Choice offered to / list presented to : Patient      Discharge Placement              Patient chooses bed at:  ([facility]) Patient to be transferred to facility by: [facility]      Discharge Plan and Services Additional resources added to the After Visit Summary for     Discharge Planning Services: CM Consult Post Acute Care Choice: IP Rehab (Patient requesting to go somewhere close to his home, lives in [place], [place])                               Social Drivers of Health (SDOH) Interventions SDOH Screenings   Food Insecurity: Patient Declined (07/27/2024)  Housing: Patient Declined (07/27/2024)  Transportation Needs: Patient Declined (07/27/2024)  Utilities: Patient Declined (07/27/2024)  Financial Resource Strain: Medium Risk (08/08/2023)   Received from [facility]  Social Connections: Unknown (07/27/2024)  Tobacco Use: Medium Risk (07/27/2024)     Readmission Risk Interventions     No data to display

## 2024-08-01 NOTE — Progress Notes (Addendum)
 Report given to Richie Rn at [facility]

## 2024-08-01 NOTE — Plan of Care (Signed)
  Problem: Education: Goal: Ability to describe self-care measures that may prevent or decrease complications (Diabetes Survival Skills Education) will improve Outcome: Progressing   Problem: Fluid Volume: Goal: Ability to maintain a balanced intake and output will improve Outcome: Progressing   Problem: Skin Integrity: Goal: Risk for impaired skin integrity will decrease Outcome: Progressing   Problem: Education: Goal: Knowledge of disease or condition will improve Outcome: Progressing

## 2024-08-01 NOTE — TOC Progression Note (Signed)
 Transition of Care [facility]) - Progression Note    Patient Details  Name: SHONN FARRUGGIA MRN: [id number] Date of Birth: Jan 09, 1941  [facility]) CM/SW Contact  Dalia GORMAN Fuse, RN Phone Number: 08/01/2024, 10:17 AM  Clinical Narrative:    Ins shara is pending for [facility]. Patient has bed offers from [facility] & [facility]. [facility] declined to offer.   DC disposition is [facility] IPR vs SNF.  TOC will continue to follow  Expected Discharge Plan: IP Rehab Facility Barriers to Discharge: Continued Medical Work up               Expected Discharge Plan and Services   Discharge Planning Services: CM Consult Post Acute Care Choice: IP Rehab (Patient requesting to go somewhere close to his home, lives in [place], [place]) Living arrangements for the past 2 months: Single Family Home                                       Social Drivers of Health (SDOH) Interventions SDOH Screenings   Food Insecurity: Patient Declined (07/27/2024)  Housing: Patient Declined (07/27/2024)  Transportation Needs: Patient Declined (07/27/2024)  Utilities: Patient Declined (07/27/2024)  Financial Resource Strain: Medium Risk (08/08/2023)   [facility]  Social Connections: Unknown (07/27/2024)  Tobacco Use: Medium Risk (07/27/2024)    Readmission Risk Interventions     No data to display

## 2024-08-01 NOTE — Progress Notes (Signed)
 Physical Therapy Treatment Patient Details Name: Joseph Clements MRN: [id number] DOB: 20-Oct-1940 Today's Date: 08/01/2024   History of Present Illness Joseph Clements is a 83 y.o. male with medical history significant of HTN, DM, anxiety/depression, BPH, presented with slurred speech. 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    PT Comments  Pt in chair with lunch finished. Pt more alert today, speech slowly improving in clarity, no difficulty today with word recall than articulation. Pt less limited by abnormal postural tendencies today, partakes in 2 walking bouts with RW, cues for stride length help quell shuffling, but pt needs anticipatory cues to avoid obstacles on Rt hemi neglect field. Pt reports fatigability is improving. Will continue to follow.    If plan is discharge home, recommend the following: A little help with walking and/or transfers;A lot of help with bathing/dressing/bathroom;Assistance with feeding;Assistance with cooking/housework;Supervision due to cognitive status;Help with stairs or ramp for entrance   Can travel by private vehicle        Equipment Recommendations  Rolling walker (2 wheels)    Recommendations for Other Services Rehab consult     Precautions / Restrictions       Mobility  Bed Mobility                    Transfers Overall transfer level: Needs assistance Equipment used: Rolling walker (2 wheels) Transfers: Sit to/from Stand Sit to Stand: Contact guard assist           General transfer comment: cues for best hand placement    Ambulation/Gait   Gait Distance (Feet): 120 Feet Assistive device: Rolling walker (2 wheels)   Gait velocity: 0.12m/s     General Gait Details: shuffle, but does better woith cues for bigger steps.   Stairs             Wheelchair Mobility     Tilt Bed    Modified Rankin (Stroke Patients Only)       Balance                                             Communication    Cognition                                        Cueing    Exercises Other Exercises Other Exercises: STS from recliner x10, hands on arm rests; Other Exercises: AMB x119ft, twice with 5 minute break between;    General Comments        Pertinent Vitals/Pain Pain Assessment Pain Assessment: No/denies pain    Home Living                          Prior Function            PT Goals (current goals can now be found in the care plan section) Acute Rehab PT Goals Patient Stated Goal: improve PT Goal Formulation: With patient Time For Goal Achievement: 08/11/24 Potential to Achieve Goals: Good Progress towards PT goals: Progressing toward goals    Frequency    Min 3X/week      PT Plan      Co-evaluation  AM-PAC PT 6 Clicks Mobility   Outcome Measure  Help needed turning from your back to your side while in a flat bed without using bedrails?: A Lot Help needed moving from lying on your back to sitting on the side of a flat bed without using bedrails?: A Lot Help needed moving to and from a bed to a chair (including a wheelchair)?: A Little Help needed standing up from a chair using your arms (e.g., wheelchair or bedside chair)?: A Little Help needed to walk in hospital room?: A Lot Help needed climbing 3-5 steps with a railing? : A Lot 6 Click Score: 14    End of Session Equipment Utilized During Treatment: Gait belt Activity Tolerance: Patient tolerated treatment well;No increased pain;Patient limited by fatigue Patient left: in bed;with bed alarm set;with nursing/sitter in room;with call bell/phone within reach Nurse Communication: Mobility status PT Visit Diagnosis: Other abnormalities of gait and mobility (R26.89);Muscle weakness (generalized) (M62.81);Difficulty in walking, not elsewhere classified (R26.2);Unsteadiness on feet (R26.81);Hemiplegia and  hemiparesis Hemiplegia - Right/Left: Right Hemiplegia - dominant/non-dominant: Dominant Hemiplegia - caused by: Cerebral infarction     Time: 8690-8661 PT Time Calculation (min) (ACUTE ONLY): 29 min  Charges:    $Neuromuscular Re-education: 23-37 mins PT General Charges $$ ACUTE PT VISIT: 1 Visit                    1:53 PM, 08/01/24 Peggye JAYSON Linear, PT, DPT Physical Therapist - [facility]  [phone] ([facility])    Woodrow Dulski C 08/01/2024, 1:50 PM

## 2024-08-01 NOTE — Progress Notes (Signed)
 Progress Note   Patient: Joseph Clements FMW:[id number] DOB: 1941/05/14 DOA: 07/27/2024     5 DOS: the patient was seen and examined on 08/01/2024   Brief hospital course: Joseph Clements is a 83 y.o. male with medical history significant of HTN, DM, anxiety/depression, BPH, presented with slurred speech. 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 Patient is seen by neurosurgery, okay to start dual antiplatelet treatment.   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 # Small subdural hematoma. Appreciate neurology and neurosurgery consult,  continue statin and dual antiplatelet treatment for 21 days followed by baby aspirin  81 mg p.o. daily.   Patient has been seen by PT/OT, recommended acute rehab. Patient had a worsening dysarthria on 12/1, repeated CT scan and MRI did not show worsening stroke or bleeding.   EEG negative for seizures Echo: EF 60-65%, grade 1 diastolic dysfunction.  No valvular disease.  No LA dilation.  No PFO.  Currently patient is stable to discharge, awaiting for placement.    # Chronic kidney disease stage IIIa. sCr 1.48 slightly elevated could be secondary to dehydration prerenal, Patient was encouraged for oral fluid intake. Discussed IV fluid for patient will try to drink more water today. 12/5 sCr 1.59 elevated, started NS 100 mL/h for overnight hydration.   # Hypokalemia; Resolved, Potassium normalized    # Essential hypertension. 12/3 labetalol  10 mg x 1 dose given Started Lopressor  25 mg p.o. twice daily and losartan  50 mg p.o. daily Monitor BP and titrate medications according  # NIDDM  T2 At home patient was on glipizide and Tradjenta  Continue Tradjenta  Started NovoLog  sliding scale during hospital stay Continue diabetic diet, monitor CBG  # Vitamin B12 level 213, goal >400: Started vitamin B12 1000 mcg IM injection daily during hospital stay, followed by oral supplement.  Follow-up PCP to repeat vitamin B12 level after 3 to 6 months.  # Vitamin D  level 30.7, at borderline: started vitamin D  50,000 units p.o. weekly to prevent deficiency. follow with PCP to repeat vitamin D  level after 3 to 6 months.   Body mass index is 28.19 kg/m.   Subjective:  No significant events overnight. Patient still has dysarthria and generalized weakness, no any other active issues Patient is aware that he is waiting for IPR placement.   Physical Exam: Vitals:   08/01/24 0400 08/01/24 0755 08/01/24 1154 08/01/24 1200  BP: (!) 172/74 (!) 146/109 121/70   Pulse: 68 72 72   Resp:  17 17 17   Temp: 97.7 F (36.5 C) 97.9 F (36.6 C) 98.6 F (37 C)   TempSrc: Oral Oral    SpO2: 100% 97% 95% 95%  Weight:      Height:       General exam: Appears calm and comfortable  Respiratory system: Clear to auscultation. Respiratory effort normal. Cardiovascular system: S1 & S2 heard, RRR. No JVD, murmurs, rubs, gallops or clicks. No pedal edema. Gastrointestinal system: Abdomen is nondistended, soft and nontender. No organomegaly or masses felt. Normal bowel sounds heard. Central nervous system: Alert and oriented x3.  Slurred speech and right facial droop  Extremities: No edema Skin: No rashes, lesions or ulcers Psychiatry: Judgement and insight appear normal. Mood & affect appropriate.    Data Reviewed:  Reviewed MRI and CT scan.  Family Communication: None  Disposition: Status is: Inpatient Remains inpatient appropriate because: Severity of disease, pending placement. 12/5 DC plan to inpatient rehab placement, insurance Auth is pending as per TOC     Time spent: 35  minutes  Author: Elvan Sor, MD 08/01/2024 2:32 PM  For on call review [email or web address].

## 2024-08-13 ENCOUNTER — Telehealth: Payer: Self-pay | Admitting: Neurosurgery

## 2024-08-13 NOTE — Telephone Encounter (Signed)
 Patient's wife called back and states that the patient had a stroke and is in rehab and cannot use his phone. She asked that we call her phone number in the future and her number was placed in the chart. She states that we need to contact the facility to get updates on the patient but she states that he is being discharged home on Friday. She could not provide me with any information on the facility only that it was in [facility]. I tried to explain to the patient's wife what the appointment was for, she kept interrupting me and hung up before I could explain.

## 2024-08-13 NOTE — Telephone Encounter (Signed)
 non urgetn clinic f/u with head CT okay for brooke or danielle. 4-6 week     Lyndee Ritta BIRCH 08/13/2024 09:27 AM EST Left voicemail  ------------------------------------ Georgina Hart N 08/05/2024 11:45 AM EST Left voicemail ------------------------------------ Lyndee Ritta D 08/01/2024 10:02 AM EST Left voicemail  ------------------------------------ Lyndee Ritta D 07/30/2024 03:11 PM EST Left voicemail for patient

## 2024-08-26 ENCOUNTER — Emergency Department

## 2024-08-26 ENCOUNTER — Emergency Department
Admission: EM | Admit: 2024-08-26 | Discharge: 2024-08-26 | Disposition: A | Discharge status: Home / Self Care | Attending: Emergency Medicine | Admitting: Emergency Medicine

## 2024-08-26 ENCOUNTER — Other Ambulatory Visit: Payer: Self-pay

## 2024-08-26 DIAGNOSIS — E1122 Type 2 diabetes mellitus with diabetic chronic kidney disease: Secondary | ICD-10-CM | POA: Insufficient documentation

## 2024-08-26 DIAGNOSIS — J069 Acute upper respiratory infection, unspecified: Secondary | ICD-10-CM | POA: Diagnosis not present

## 2024-08-26 DIAGNOSIS — N189 Chronic kidney disease, unspecified: Secondary | ICD-10-CM | POA: Insufficient documentation

## 2024-08-26 DIAGNOSIS — R059 Cough, unspecified: Secondary | ICD-10-CM | POA: Diagnosis present

## 2024-08-26 DIAGNOSIS — E039 Hypothyroidism, unspecified: Secondary | ICD-10-CM | POA: Insufficient documentation

## 2024-08-26 MED ORDER — BENZONATATE 100 MG PO CAPS: 200.0000 mg | ORAL_CAPSULE | Freq: Three times a day (TID) | ORAL | 0 refills | Status: AC | PRN

## 2024-08-26 NOTE — Discharge Instructions (Signed)
 Your exam, vital signs, and chest XR are normal and reassuring. Your chest XR did not show any signs of pneumonia or bronchitis. You may continue with OTC Coricidin Cough tabs or trial OTC Delsym Cough syrup for cough relief. If symptoms persist, or fevers develop, consider taking an OTC covid and flu test.

## 2024-08-26 NOTE — ED Provider Notes (Signed)
 "   [facility] Emergency Department Provider Note     Event Date/Time   First MD Initiated Contact with Patient 08/26/24 1637     (approximate)   History   Cough   HPI  Joseph Clements is a 83 y.o. male with a history of anxiety, depression, DM type II, HLD, hypothyroidism, CVA, and CKD, presents to the ED for evaluation of shortness of breath and cough.  Patient endorses only 1 week complaint of cough.  He was recently discharged from skilled nursing facility following his recent acute CVA with residual right sided weakness.  Patient denies any chest pain, shortness of breath, fever, chills, or sweats.  He has been taken OTC Coricidin HBP for cough relief, and notes some limited improvement.  Physical Exam   Triage Vital Signs: ED Triage Vitals  Encounter Vitals Group     BP 08/26/24 1506 (!) 157/63     Girls Systolic BP Percentile --      Girls Diastolic BP Percentile --      Boys Systolic BP Percentile --      Boys Diastolic BP Percentile --      Pulse Rate 08/26/24 1506 70     Resp 08/26/24 1506 16     Temp 08/26/24 1506 (!) 97.5 F (36.4 C)     Temp Source 08/26/24 1506 Oral     SpO2 08/26/24 1505 96 %     Weight 08/26/24 1506 179 lb 14.3 oz (81.6 kg)     Height --      Head Circumference --      Peak Flow --      Pain Score 08/26/24 1506 0     Pain Loc --      Pain Education --      Exclude from Growth Chart --     Most recent vital signs: Vitals:   08/26/24 1703 08/26/24 1705  BP:  (!) 153/83  Pulse:    Resp:    Temp: 97.8 F (36.6 C)   SpO2:      General Awake, no distress. NAD HEENT NCAT. PERRL. EOMI. No rhinorrhea. Mucous membranes are moist.  CV:  Good peripheral perfusion. RRR RESP:  Normal effort. CTA ABD:  No distention.    ED Results / Procedures / Treatments   Labs (all labs ordered are listed, but only abnormal results are displayed) Labs Reviewed - No data to display   EKG   RADIOLOGY  I personally  viewed and evaluated these images as part of my medical decision making, as well as reviewing the written report by the radiologist.  ED Provider Interpretation: No acute findings  DG Chest 2 View Result Date: 08/26/2024 CLINICAL DATA:  Cough for 1 week EXAM: CHEST - 2 VIEW COMPARISON:  June 06, 2008 FINDINGS: The heart size and mediastinal contours are within normal limits. Both lungs are clear. The visualized skeletal structures are unremarkable. IMPRESSION: No active cardiopulmonary disease. Electronically Signed   By: Lynwood Landy Raddle M.D.   On: 08/26/2024 16:11     PROCEDURES:  Critical Care performed: No  Procedures   MEDICATIONS ORDERED IN ED: Medications - No data to display   IMPRESSION / MDM / ASSESSMENT AND PLAN / ED COURSE  I reviewed the triage vital signs and the nursing notes.                              Differential diagnosis  includes, but is not limited to, COVID, flu, RSV, viral URI, CAP, bronchiolitis  Patient's presentation is most consistent with acute complicated illness / injury requiring diagnostic workup.  Patient's diagnosis is consistent with viral URI with cough.  Jerrett patient presents to the ED in no acute distress.  He is afebrile on presentation, with normal room air sats.  Patient is endorsing intermittent nonproductive cough for the last week.  Denies any interim fevers, chest pain, or shortness of breath.  Patient has declined desire for viral panel testing at this time.  Chest x-ray interpreted by me, shows no acute intrathoracic process.  Patient will be discharged home with prescriptions for salon Perles to take with OTC Delsym or continue with his Coricidin HBP. Patient is to follow up with his primary provider as suggested, as needed or otherwise directed. Patient is given ED precautions to return to the ED for any worsening or new symptoms.   FINAL CLINICAL IMPRESSION(S) / ED DIAGNOSES   Final diagnoses:  Viral URI with cough      Rx / DC Orders   ED Discharge Orders          Ordered    benzonatate (TESSALON) 100 MG capsule  3 times daily PRN        08/26/24 1702             Note:  This document was prepared using Dragon voice recognition software and may include unintentional dictation errors.    Loyd Candida LULLA Aldona, PA-C 08/26/24 JACKEY Willo Dunnings, MD 08/26/24 2315  "

## 2024-08-26 NOTE — ED Triage Notes (Signed)
 BIB [facility] from home for c/o  SOB and persistent cough.  Cough x 1 week.  REcent CVA with right sided residual weakness, just home from rehab.  VS wnl.

## 2024-08-26 NOTE — ED Notes (Signed)
 Pt and wife given DC instructions. Both verbalized understanding of medication and follow up care. Pt taken from Ed in wheelchair by this RN.

## 2024-09-04 ENCOUNTER — Other Ambulatory Visit: Payer: Self-pay | Admitting: *Deleted

## 2024-09-04 DIAGNOSIS — N201 Calculus of ureter: Secondary | ICD-10-CM

## 2024-09-15 ENCOUNTER — Ambulatory Visit: Payer: Self-pay | Admitting: Urology

## 2025-01-06 ENCOUNTER — Ambulatory Visit: Admitting: Dermatology
